# Patient Record
Sex: Male | Born: 1990 | Race: White | Hispanic: No | Marital: Single | Smoking: Current every day smoker
Health system: Southern US, Community
[De-identification: ages and names within clinical notes are randomized; demographics above are authoritative.]

## PROBLEM LIST (undated history)

## (undated) DIAGNOSIS — F1910 Other psychoactive substance abuse, uncomplicated: Secondary | ICD-10-CM

## (undated) DIAGNOSIS — F419 Anxiety disorder, unspecified: Secondary | ICD-10-CM

## (undated) HISTORY — DX: Anxiety disorder, unspecified: F41.9

## (undated) HISTORY — DX: Other psychoactive substance abuse, uncomplicated: F19.10

---

## 2014-08-27 ENCOUNTER — Ambulatory Visit (INDEPENDENT_AMBULATORY_CARE_PROVIDER_SITE_OTHER): Payer: BLUE CROSS/BLUE SHIELD

## 2014-08-27 ENCOUNTER — Ambulatory Visit (INDEPENDENT_AMBULATORY_CARE_PROVIDER_SITE_OTHER): Payer: BLUE CROSS/BLUE SHIELD | Admitting: Family Medicine

## 2014-08-27 VITALS — BP 112/72 | HR 67 | Temp 97.9°F | Resp 16 | Ht 71.0 in | Wt 218.0 lb

## 2014-08-27 DIAGNOSIS — Z418 Encounter for other procedures for purposes other than remedying health state: Secondary | ICD-10-CM

## 2014-08-27 DIAGNOSIS — S93402A Sprain of unspecified ligament of left ankle, initial encounter: Secondary | ICD-10-CM

## 2014-08-27 DIAGNOSIS — Z298 Encounter for other specified prophylactic measures: Secondary | ICD-10-CM

## 2014-08-27 DIAGNOSIS — F1121 Opioid dependence, in remission: Secondary | ICD-10-CM

## 2014-08-27 MED ORDER — GABAPENTIN 300 MG PO CAPS: 300.0000 mg | ORAL_CAPSULE | Freq: Three times a day (TID) | ORAL | Status: DC

## 2014-08-27 NOTE — Patient Instructions (Signed)
Continue your gabapentin - please try to clarify why you are taking  If you do not need it we can continue to taper you off gradually Wear the ankle support as needed and use NSAID mediations (aleve, etc) as needed Let us know if your ankle does not improve over the next few weeks Please work on your balance as we discussed Please come and see us in abut 3 months to discuss your progress.

## 2014-08-27 NOTE — Progress Notes (Signed)
[facility] [address] [place] [address] [phone]  Date:  08/27/2014   Name:  Bob Fowler   DOB:  November 16, 1990   MRN:  [id number]  PCP:  No PCP Per Patient    Chief Complaint: Medication Refill and Ankle Pain   History of Present Illness:  Bob Fowler is a 24 y.o. very pleasant male patient who presents with the following:  He is here today with a left ankle injury.  He is treated with gabapentin for anxiety and "to prevent seizures."  He has never actually had a seizure.     He completed treatment at [facility] 3 months ago.  He has been tapering his gabapentin to 300 mg TID- his current dose,  Not quite sure of what his original dose was when he was discharged.  Needs a RF of this medication  He rolled his left ankle yesterday- about 6 weeks ago he had a similar injury, he is not sure if it healed totally in between.  Notes that his last injury seemed more severe and that could not walk for several days. This time he is able to walk on the ankle, but it is swollen and tender laterally.  He notes a history of frequent left ankle sprains   He is OW generally in good health He is otherwise unhurt from yesterday's accident  He was in [facility] for opiates and benzos.  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   Past Medical History  Diagnosis Date  . Anxiety   . Substance abuse     History reviewed. No pertinent past surgical history.  History  Substance Use Topics  . Smoking status: Current Every Day Smoker  . Smokeless tobacco: Not on file  . Alcohol Use: Not on file    Family History  Problem Relation Age of Onset  . Diabetes Mother   . Mental illness Maternal Grandmother   . Heart disease Maternal Grandfather     No Known Allergies  Medication list has been reviewed and updated.  No current outpatient prescriptions on file prior to  visit.   No current facility-administered medications on file prior to visit.    Review of Systems:  As per HPI- otherwise negative.   Physical Examination: Filed Vitals:   08/27/14 1241  BP: 112/72  Pulse: 67  Temp: 97.9 F (36.6 C)  Resp: 16   Filed Vitals:   08/27/14 1241  Height:  (1.803 m)  Weight: 218 lb (98.884 kg)   Body mass index is 30.42 kg/(m^2). Ideal Body Weight: Weight in (lb) to have BMI = 25: 178.9  GEN: WDWN, NAD, Non-toxic, A & O x 3, mild overweigh, looks well HEENT: Atraumatic, Normocephalic. Neck supple. No masses, No LAD. Ears and Nose: No external deformity. CV: RRR, No M/G/R. No JVD. No thrill. No extra heart sounds. PULM: CTA B, no wheezes, crackles, rhonchi. No retractions. No resp. distress. No accessory muscle use. EXTR: No c/c/e NEURO Normal gait.  PSYCH: Normally interactive. Conversant. Not depressed or anxious appearing.  Calm demeanor.  Left ankle: he has lateral ankle swelling and tenderness, no bruise or redness.  Achilles is intact, no tenderness of the 5th MT, slight tenderness of the anterior ankle as well  [facility] reading (PRIMARY) by  Bob Fowler. Left ankle: possible old talus avulsion fracture    LEFT ANKLE COMPLETE -  3+ VIEW  COMPARISON: None.  FINDINGS: Moderate to marked lateral malleolar soft tissue swelling. Os trigonum, accessory ossicle. Suspect an ankle joint effusion with anterior soft tissue fullness on the lateral view. Base of fifth metatarsal and talar dome intact.  IMPRESSION: Lateral and anterior soft tissue swelling, without acute osseous abnormality.  Assessment and Plan: Left ankle sprain, initial encounter - Plan: DG Ankle Complete Left  History of narcotic addiction  Seizure prophylaxis - Plan: gabapentin (NEURONTIN) 300 MG capsule  Placed in a sweed-o ankle brace for support, discussed gradual return to activities and exercises to improve proprioception Refilled her gabapentin.  He would  like to continue to taper this medication which I think is reasonable ; discussed safe taper regimens, he will see me in about 3 months for a recheck   Signed Bob AmsterdamJessica Tacara Hadlock, MD

## 2014-11-11 ENCOUNTER — Other Ambulatory Visit: Payer: Self-pay | Admitting: Family Medicine

## 2015-04-12 ENCOUNTER — Other Ambulatory Visit: Payer: Self-pay | Admitting: Family Medicine

## 2015-05-04 ENCOUNTER — Ambulatory Visit (INDEPENDENT_AMBULATORY_CARE_PROVIDER_SITE_OTHER): Payer: BLUE CROSS/BLUE SHIELD | Admitting: Physician Assistant

## 2015-05-04 VITALS — BP 118/73 | HR 82 | Temp 98.2°F | Resp 20 | Ht 71.0 in | Wt 181.2 lb

## 2015-05-04 DIAGNOSIS — F419 Anxiety disorder, unspecified: Secondary | ICD-10-CM | POA: Diagnosis not present

## 2015-05-04 DIAGNOSIS — F1121 Opioid dependence, in remission: Secondary | ICD-10-CM

## 2015-05-04 DIAGNOSIS — Z720 Tobacco use: Secondary | ICD-10-CM | POA: Diagnosis not present

## 2015-05-04 DIAGNOSIS — G4700 Insomnia, unspecified: Secondary | ICD-10-CM

## 2015-05-04 MED ORDER — GABAPENTIN 300 MG PO CAPS: ORAL_CAPSULE | ORAL | Status: DC

## 2015-05-04 NOTE — Progress Notes (Signed)
[facility] [address], [place] [id number] [phone]  Date:  05/04/2015   Name:  Bob FesterDylan Fowler   DOB:  12/13/1990   MRN:  [id number]  PCP:  No PCP Per Patient    Chief Complaint: Medication Refill   History of Present Illness:  This is a 25 y.o. male with PMH hx narcotic addiction who is presenting for gabapentin refill. Taking 300 mg gabapentin TID for anxiety. Feels anxious esp in social settings. Feels nervous, palms sweat, wants to hide in a corner. Has had panic attacks before but doesn't get them regularly. Feels the gabapentin helps him. He has never been on an SSRI before. He was started on the gabapentin while being treated for substance abuse of benzos and opiates at [facility] 9 months ago. He has been clean since. Mood is good, generally happy. He just started with a therapist but isn't sure if he likes him. Doesn't sleep well. Wakes every couple hours. Goes to bed around 11 pm. Usually up at 5:30. Not taking anything for sleep. Smokes cigarettes - isn't sure if he wants to quit.   Review of Systems:  Review of Systems See HPI  Patient Active Problem List   Diagnosis Date Noted  . History of narcotic addiction (HCC) 08/27/2014    Prior to Admission medications   Medication Sig Start Date End Date Taking? Authorizing Provider  gabapentin (NEURONTIN) 300 MG capsule TAKE 1 CAPSULE BY MOUTH 3 TIMES DAILY. 11/13/14  Yes Pearline CablesJessica C Copland, MD    No Known Allergies  History reviewed. No pertinent past surgical history.  Social History  Substance Use Topics  . Smoking status: Current Every Day Smoker  . Smokeless tobacco: None  . Alcohol Use: None    Family History  Problem Relation Age of Onset  . Diabetes Mother   . Mental illness Maternal Grandmother   . Heart disease Maternal Grandfather     Medication list has been reviewed and updated.  Physical Examination:  Physical Exam  Constitutional: He is oriented to person,  place, and time. He appears well-developed and well-nourished. No distress.  HENT:  Head: Normocephalic and atraumatic.  Right Ear: Hearing normal.  Left Ear: Hearing normal.  Nose: Nose normal.  Eyes: Conjunctivae and lids are normal. Right eye exhibits no discharge. Left eye exhibits no discharge. No scleral icterus.  Cardiovascular: Normal rate, regular rhythm, normal heart sounds and normal pulses.   No murmur heard. Pulmonary/Chest: Effort normal and breath sounds normal. No respiratory distress. He has no wheezes. He has no rhonchi. He has no rales.  Musculoskeletal: Normal range of motion.  Neurological: He is alert and oriented to person, place, and time.  Skin: Skin is warm, dry and intact. No lesion and no rash noted.  Psychiatric: He has a normal mood and affect. His speech is normal and behavior is normal. Thought content normal.   BP 118/73 mmHg  Pulse 82  Temp(Src) 98.2 F (36.8 C) (Oral)  Resp 20  Ht 5\' 11"  (1.803 m)  Wt 181 lb 3.2 oz (82.192 kg)  BMI 25.28 kg/m2  SpO2 98%  Assessment and Plan:  1. Anxiety Gabapentin working well for him. Can always try an SSRI at a later date if needed. Gave contact info for other therapists in the area. Return in 6 months for follow up. - gabapentin (NEURONTIN) 300 MG capsule; TAKE 1 CAPSULE BY MOUTH 3 TIMES DAILY.  Dispense: 90 capsule; Refill: 5  2. Insomnia Advised  try melatonin up to 10 mg QHS  3. History of narcotic addiction (HCC) In Remission. Doing well.  4. Tobacco abuse Does not desire to quit at this time.   Roswell Miners Dyke Brackett, MHS [facility]  05/04/2015

## 2015-05-04 NOTE — Patient Instructions (Signed)
Continue gabapentin Try melatonin up to 10 mg at night  For therapy -- [facility] ([address], Ernest McCoy, Heather Kitchens, Karla [place]) - [phone] [facility] Raynelle Fanning([place]) - [phone] [facility] - [phone] [facility] - [phone] (do not file insurance) [facility] - accepts most major insurances. [phone] or email [email or web address]

## 2015-06-12 ENCOUNTER — Ambulatory Visit (INDEPENDENT_AMBULATORY_CARE_PROVIDER_SITE_OTHER): Payer: BLUE CROSS/BLUE SHIELD | Admitting: Physician Assistant

## 2015-06-12 ENCOUNTER — Other Ambulatory Visit: Payer: Self-pay | Admitting: Physician Assistant

## 2015-06-12 ENCOUNTER — Ambulatory Visit (HOSPITAL_COMMUNITY)
Admission: RE | Admit: 2015-06-12 | Discharge: 2015-06-12 | Disposition: A | Discharge status: Home / Self Care | Payer: BLUE CROSS/BLUE SHIELD | Source: Ambulatory Visit | Attending: Physician Assistant | Admitting: Physician Assistant

## 2015-06-12 VITALS — BP 124/72 | HR 76 | Temp 97.9°F | Resp 16 | Ht 71.0 in | Wt 190.2 lb

## 2015-06-12 DIAGNOSIS — N5089 Other specified disorders of the male genital organs: Secondary | ICD-10-CM | POA: Diagnosis present

## 2015-06-12 DIAGNOSIS — N451 Epididymitis: Secondary | ICD-10-CM

## 2015-06-12 DIAGNOSIS — K4090 Unilateral inguinal hernia, without obstruction or gangrene, not specified as recurrent: Secondary | ICD-10-CM | POA: Diagnosis not present

## 2015-06-12 LAB — POC MICROSCOPIC URINALYSIS (UMFC): Mucus: ABSENT

## 2015-06-12 LAB — POCT CBC
Granulocyte percent: 66.7 %G (ref 37–80)
HCT, POC: 39.6 % — AB (ref 43.5–53.7)
HEMOGLOBIN: 14 g/dL — AB (ref 14.1–18.1)
LYMPH, POC: 1.8 (ref 0.6–3.4)
MCH: 27.5 pg (ref 27–31.2)
MCHC: 35.5 g/dL — AB (ref 31.8–35.4)
MCV: 77.4 fL — AB (ref 80–97)
MID (cbc): 0.6 (ref 0–0.9)
MPV: 6.3 fL (ref 0–99.8)
PLATELET COUNT, POC: 252 10*3/uL (ref 142–424)
POC Granulocyte: 4.7 (ref 2–6.9)
POC LYMPH PERCENT: 24.8 %L (ref 10–50)
POC MID %: 8.5 %M (ref 0–12)
RBC: 5.11 M/uL (ref 4.69–6.13)
RDW, POC: 14.5 %
WBC: 7.1 10*3/uL (ref 4.6–10.2)

## 2015-06-12 LAB — POCT URINALYSIS DIP (MANUAL ENTRY)
BILIRUBIN UA: NEGATIVE
Bilirubin, UA: NEGATIVE
Blood, UA: NEGATIVE
Glucose, UA: NEGATIVE
Nitrite, UA: NEGATIVE
PH UA: 7
PROTEIN UA: NEGATIVE
Spec Grav, UA: 1.02
Urobilinogen, UA: 0.2

## 2015-06-12 MED ORDER — CIPROFLOXACIN HCL 500 MG PO TABS: 500.0000 mg | ORAL_TABLET | Freq: Two times a day (BID) | ORAL | Status: DC

## 2015-06-12 MED ORDER — IBUPROFEN 800 MG PO TABS: 800.0000 mg | ORAL_TABLET | Freq: Three times a day (TID) | ORAL | Status: AC

## 2015-06-12 NOTE — Addendum Note (Signed)
Addended by: Ofilia NeasLARK, MICHAEL L on: 06/12/2015 07:01 PM   Modules accepted: Orders

## 2015-06-12 NOTE — Progress Notes (Addendum)
06/12/2015 6:16 PM   DOB: January 29, 1991 / MRN: [id number]  SUBJECTIVE:  Bob Fowler is a 25 y.o. male presenting for frank testicular swelling that started three days ago.  Reports that he did some heavy lifting last week but this problem seems to have occurred well after that.  Denies a history of hernia.  Reports the testical is painful.  He felt mildly feverish two nights ago but reports this has resolved.  He has not tried anything for his symptoms.  He denies any new partners and reports he has been with his current girlfriend for years now.  They live in the same household and he reports the relationship is good.   He does practice anal sex with his girlfriend.   He has No Known Allergies.   He  has a past medical history of Anxiety and Substance abuse.    He  reports that he has been smoking.  He does not have any smokeless tobacco history on file. He  has no sexual activity history on file. The patient  has no past surgical history on file.  His family history includes Diabetes in his mother; Heart disease in his maternal grandfather; Mental illness in his maternal grandmother.  Review of Systems  Constitutional: Negative for fever.  Respiratory: Negative for cough.   Gastrointestinal: Negative for nausea, vomiting, abdominal pain, diarrhea and constipation.  Musculoskeletal: Negative for myalgias.  Skin: Negative for rash.  Neurological: Negative for dizziness and headaches.    Problem list and medications reviewed and updated by myself where necessary, and exist elsewhere in the encounter.   OBJECTIVE:  BP 124/72 mmHg  Pulse 76  Temp(Src) 97.9 F (36.6 C) (Oral)  Resp 16  Ht  (1.803 m)  Wt 190 lb 3.2 oz (86.274 kg)  BMI 26.54 kg/m2  SpO2 96%  Physical Exam  Constitutional: He is oriented to person, place, and time. He appears well-developed. He does not appear ill.  Eyes: Conjunctivae and EOM are normal. Pupils are equal, round, and reactive to light.    Cardiovascular: Normal rate and regular rhythm.   Pulmonary/Chest: Effort normal and breath sounds normal.  Abdominal: He exhibits no distension.  Genitourinary:     Musculoskeletal: Normal range of motion.  Neurological: He is alert and oriented to person, place, and time. No cranial nerve deficit. Coordination normal.  Skin: Skin is warm and dry. He is not diaphoretic.  Psychiatric: He has a normal mood and affect.  Nursing note and vitals reviewed.   Results for orders placed or performed in visit on 06/12/15 (from the past 72 hour(s))  POCT urinalysis dipstick     Status: Abnormal   Collection Time: 06/12/15  3:08 PM  Result Value Ref Range   Color, UA yellow yellow   Clarity, UA cloudy (A) clear   Glucose, UA negative negative   Bilirubin, UA negative negative   Ketones, POC UA negative negative   Spec Grav, UA 1.020    Blood, UA negative negative   pH, UA 7.0    Protein Ur, POC negative negative   Urobilinogen, UA 0.2    Nitrite, UA Negative Negative   Leukocytes, UA Trace (A) Negative  POCT Microscopic Urinalysis ([facility])     Status: Abnormal   Collection Time: 06/12/15  3:08 PM  Result Value Ref Range   WBC,UR,HPF,POC Few (A) None WBC/hpf   RBC,UR,HPF,POC None None RBC/hpf   Bacteria Many (A) None, Too numerous to count   Mucus Absent Absent  Epithelial Cells, UR Per Microscopy Few (A) None, Too numerous to count cells/hpf   Amorphous many   POCT CBC     Status: Abnormal   Collection Time: 06/12/15  3:16 PM  Result Value Ref Range   WBC 7.1 4.6 - 10.2 K/uL   Lymph, poc 1.8 0.6 - 3.4   POC LYMPH PERCENT 24.8 10 - 50 %L   MID (cbc) 0.6 0 - 0.9   POC MID % 8.5 0 - 12 %M   POC Granulocyte 4.7 2 - 6.9   Granulocyte percent 66.7 37 - 80 %G   RBC 5.11 4.69 - 6.13 M/uL   Hemoglobin 14.0 (A) 14.1 - 18.1 g/dL   HCT, POC 95.6 (A) 21.3 - 53.7 %   MCV 77.4 (A) 80 - 97 fL   MCH, POC 27.5 27 - 31.2 pg   MCHC 35.5 (A) 31.8 - 35.4 g/dL   RDW, POC 08.6 %   Platelet  Count, POC 252 142 - 424 K/uL   MPV 6.3 0 - 99.8 fL    US Scrotum  06/12/2015  CLINICAL DATA:  25 year old male with right-sided testicular pain and swelling for the past 2 days, progressively worsening. EXAM: SCROTAL ULTRASOUND DOPPLER ULTRASOUND OF THE TESTICLES TECHNIQUE: Complete ultrasound examination of the testicles, epididymis, and other scrotal structures was performed. Color and spectral Doppler ultrasound were also utilized to evaluate blood flow to the testicles. COMPARISON:  No priors. FINDINGS: Right testicle Measurements: 3.3 x 2.4 x 3.4 cm. No mass or microlithiasis visualized. Left testicle Measurements: 4.1 x 2.0 x 3.0 cm. No mass or microlithiasis visualized. Right epididymis:  Normal in size and appearance. Left epididymis:  Normal in size and appearance. Hydrocele:  None visualized. Varicocele:  None visualized. Pulsed Doppler interrogation of both testes demonstrates normal low resistance arterial and venous waveforms bilaterally, however, there is increased flow in the right epididymis when compared to the left. Other:  Fat and vessels noted in the right inguinal canal. IMPRESSION: 1. Findings suggestive of right-sided epididymitis. 2. Small right inguinal hernia containing fat and vessels. Electronically Signed   By: Trudie Reed M.D.   On: 06/12/2015 16:17   [place] Art/ven Flow Abd Pelv Doppler  06/12/2015  CLINICAL DATA:  25 year old male with right-sided testicular pain and swelling for the past 2 days, progressively worsening. EXAM: SCROTAL ULTRASOUND DOPPLER ULTRASOUND OF THE TESTICLES TECHNIQUE: Complete ultrasound examination of the testicles, epididymis, and other scrotal structures was performed. Color and spectral Doppler ultrasound were also utilized to evaluate blood flow to the testicles. COMPARISON:  No priors. FINDINGS: Right testicle Measurements: 3.3 x 2.4 x 3.4 cm. No mass or microlithiasis visualized. Left testicle Measurements: 4.1 x 2.0 x 3.0 cm. No mass or  microlithiasis visualized. Right epididymis:  Normal in size and appearance. Left epididymis:  Normal in size and appearance. Hydrocele:  None visualized. Varicocele:  None visualized. Pulsed Doppler interrogation of both testes demonstrates normal low resistance arterial and venous waveforms bilaterally, however, there is increased flow in the right epididymis when compared to the left. Other:  Fat and vessels noted in the right inguinal canal. IMPRESSION: 1. Findings suggestive of right-sided epididymitis. 2. Small right inguinal hernia containing fat and vessels. Electronically Signed   By: Trudie Reed M.D.   On: 06/12/2015 16:17    ASSESSMENT AND PLAN  Wanya was seen today for testicle pain.  Diagnoses and all orders for this visit:  Testicular swelling, right: Urine leukocytes noted on UA.  Will culture.  His  right testicle is roughly 2 times larger than the left.  Obvious concerns include testicular torsion, however this symptoms has been present for two days, there is no transverse lie, and his testicle is not exquisitely tender, however he may be headed in that direction.  Doubt hernia however it remains possible.  He denies new sexual partners and reports his girlfriend and he are monogamous.   No transillumination of the right testicle and no varicocele appreciated.  [facility] so he will have access to a hospital if that is needed.  If normal will treat him for epididymitis and will consider cipro given urine leukocytes.   -     US Scrotum; Future -     POCT urinalysis dipstick -     POCT Microscopic Urinalysis ([facility]) -     POCT CBC    The patient was advised to call or return to clinic if he does not see an improvement in symptoms or to seek the care of the closest emergency department if he worsens with the above plan.   Deliah BostonMichael Mozetta Murfin, MHS, PA-C [facility] 06/12/2015 6:16 PM   Addendum: US  showing right sided epididymitis and a small hernia. He does report practicing anal sex with his girlfriend.  This fits with non sti epididymitis.  I could not palpate a hernia.  I spoke with Dr. Llana AlimentEntrikin via phone and he felt that the inguinal hernia was most likely non contributory, and also stated it was very common.  He did not feel that anything was urgent regarding this finding.  Deliah BostonMichael Madelline Eshbach, MS, PA-C 7:01 PM, 06/12/2015

## 2015-06-12 NOTE — Patient Instructions (Signed)
     IF you received an x-ray today, you will receive an invoice from [facility]. Please contact [facility] at [phone] with questions or concerns regarding your invoice.   IF you received labwork today, you will receive an invoice from [facility]. Please contact [facility] at [phone] with questions or concerns regarding your invoice.   Our billing staff will not be able to assist you with questions regarding bills from these companies.  You will be contacted with the lab results as soon as they are available. The fastest way to get your results is to activate your My Chart account. Instructions are located on the last page of this paperwork. If you have not heard from us regarding the results in 2 weeks, please contact this office.      

## 2015-06-13 LAB — URINE CULTURE
Colony Count: NO GROWTH
Organism ID, Bacteria: NO GROWTH

## 2015-06-13 LAB — PSA: PSA: 0.23 ng/mL (ref <=4.00)

## 2015-06-25 ENCOUNTER — Other Ambulatory Visit: Payer: Self-pay | Admitting: Physician Assistant

## 2015-06-26 ENCOUNTER — Other Ambulatory Visit: Payer: Self-pay

## 2015-06-26 DIAGNOSIS — F419 Anxiety disorder, unspecified: Secondary | ICD-10-CM

## 2015-06-26 MED ORDER — GABAPENTIN 300 MG PO CAPS: ORAL_CAPSULE | ORAL | Status: DC

## 2016-09-09 ENCOUNTER — Encounter (HOSPITAL_COMMUNITY): Payer: Self-pay | Admitting: Emergency Medicine

## 2016-09-09 ENCOUNTER — Ambulatory Visit (HOSPITAL_COMMUNITY)
Admission: EM | Admit: 2016-09-09 | Discharge: 2016-09-09 | Disposition: A | Discharge status: Home / Self Care | Payer: BLUE CROSS/BLUE SHIELD | Attending: Internal Medicine | Admitting: Internal Medicine

## 2016-09-09 DIAGNOSIS — B349 Viral infection, unspecified: Secondary | ICD-10-CM

## 2016-09-09 MED ORDER — ONDANSETRON 4 MG PO TBDP: 4.0000 mg | ORAL_TABLET | Freq: Three times a day (TID) | ORAL | 0 refills | Status: DC | PRN

## 2016-09-09 NOTE — ED Provider Notes (Signed)
CSN: [id number]     Arrival date & time 09/09/16  1416 History   First MD Initiated Contact with Patient 09/09/16 1512     Chief Complaint  Patient presents with  . Fever   (Consider location/radiation/quality/duration/timing/severity/associated sxs/prior Treatment) The history is provided by the patient.  URI  Presenting symptoms: congestion, fatigue and fever   Presenting symptoms: no cough, no ear pain, no facial pain, no rhinorrhea and no sore throat   Severity:  Moderate Onset quality:  Gradual Duration:  1 day Timing:  Constant Progression:  Unchanged Chronicity:  New Relieved by:  None tried Worsened by:  Nothing Ineffective treatments:  None tried Associated symptoms: myalgias   Associated symptoms: no arthralgias, no headaches, no neck pain, no sinus pain, no sneezing, no swollen glands and no wheezing   Risk factors: no recent illness and no recent travel     Past Medical History:  Diagnosis Date  . Anxiety   . Substance abuse    History reviewed. No pertinent surgical history. Family History  Problem Relation Age of Onset  . Diabetes Mother   . Mental illness Maternal Grandmother   . Heart disease Maternal Grandfather    Social History  Substance Use Topics  . Smoking status: Current Every Day Smoker    Packs/day: 1.00    Types: Cigarettes  . Smokeless tobacco: Never Used  . Alcohol use 0.0 oz/week    Review of Systems  Constitutional: Positive for fatigue and fever.  HENT: Positive for congestion. Negative for ear pain, rhinorrhea, sinus pain, sneezing and sore throat.   Respiratory: Negative for cough and wheezing.   Cardiovascular: Negative.   Gastrointestinal: Positive for nausea and vomiting. Negative for abdominal pain and constipation.  Musculoskeletal: Positive for myalgias. Negative for arthralgias and neck pain.  Neurological: Negative for light-headedness and headaches.    Allergies  Patient has no known allergies.  Home Medications    Prior to Admission medications   Medication Sig Start Date End Date Taking? Authorizing Provider  ondansetron (ZOFRAN ODT) 4 MG disintegrating tablet Take 1 tablet (4 mg total) by mouth every 8 (eight) hours as needed for nausea or vomiting. 09/09/16   Dorena BodoKennard, Naela Nodal, NP   Meds Ordered and Administered this Visit  Medications - No data to display  BP (!) 118/51 (BP Location: Right Arm)   Pulse 61   Temp 98.1 F (36.7 C) (Oral)   Resp 20   SpO2 100%  No data found.   Physical Exam  Constitutional: He is oriented to person, place, and time. He appears well-developed and well-nourished. No distress.  HENT:  Head: Normocephalic.  Right Ear: Tympanic membrane and external ear normal.  Left Ear: Tympanic membrane and external ear normal.  Nose: Nose normal.  Mouth/Throat: Uvula is midline, oropharynx is clear and moist and mucous membranes are normal. Tonsils are 2+ on the right. Tonsils are 2+ on the left. No tonsillar exudate.  Eyes: Conjunctivae are normal.  Neck: Normal range of motion.  Cardiovascular: Normal rate and regular rhythm.   Pulmonary/Chest: Effort normal and breath sounds normal.  Abdominal: Soft. Bowel sounds are normal. There is no tenderness.  Lymphadenopathy:    He has no cervical adenopathy.  Neurological: He is alert and oriented to person, place, and time.  Skin: Skin is warm and dry. Capillary refill takes less than 2 seconds. He is not diaphoretic.  Nursing note and vitals reviewed.   Urgent Care Course     Procedures (including critical care time)  Labs Review Labs Reviewed - No data to display  Imaging Review No results found.     MDM   1. Viral illness    Most likely viral illness, recommend rest, fluids, over-the-counter therapies for symptom management. Given Zofran for nausea vomiting, follow-up with primary care return to clinic as needed.    Dorena Bodo, NP 09/09/16 1529

## 2016-09-09 NOTE — ED Triage Notes (Signed)
Pt c/o fever onset 5 days associated w/fatigue, nausea, vomiting, loss of appetite  Last had ibuprofen this am w/some relief  A&O x4... NAD... Ambulatory

## 2016-09-09 NOTE — Discharge Instructions (Signed)
You most likely have a viral illness, this type of infection will not be helped by antibiotics.  For your symptoms I have prescribed Zofran, take 1 tablet under the tongue every 8 hours as needed for nausea.  In addition to these therapies, I advise rest, plenty of fluids and management of symptoms with over the counter medicines. Over the counter therapies for your symptoms include:Tylenol as needed every 4-6 hours for body aches or fever, not to exceed 4,000 mg a day, Take mucinex or mucinex DM ever 12 hours with a full glass of water, you may use an inhaled steroid such as Flonase, 2 sprays each nostril once a day for congestion, or an antihistamine such as Claritin or Zyrtec once a day. Another alternative for congestion, is a pseudoephedrine containing product available from the pharmacist. Should your symptoms worsen or fail to resolve, follow up with your primary care provider or return to clinic.

## 2016-10-01 ENCOUNTER — Encounter (HOSPITAL_COMMUNITY): Payer: Self-pay

## 2016-10-01 ENCOUNTER — Emergency Department (HOSPITAL_COMMUNITY): Payer: BLUE CROSS/BLUE SHIELD

## 2016-10-01 ENCOUNTER — Emergency Department (HOSPITAL_COMMUNITY)
Admission: EM | Admit: 2016-10-01 | Discharge: 2016-10-01 | Disposition: A | Discharge status: Home / Self Care | Payer: BLUE CROSS/BLUE SHIELD | Attending: Emergency Medicine | Admitting: Emergency Medicine

## 2016-10-01 DIAGNOSIS — S82892A Other fracture of left lower leg, initial encounter for closed fracture: Secondary | ICD-10-CM

## 2016-10-01 DIAGNOSIS — S99912A Unspecified injury of left ankle, initial encounter: Secondary | ICD-10-CM | POA: Diagnosis present

## 2016-10-01 DIAGNOSIS — Y929 Unspecified place or not applicable: Secondary | ICD-10-CM | POA: Diagnosis not present

## 2016-10-01 DIAGNOSIS — Y9351 Activity, roller skating (inline) and skateboarding: Secondary | ICD-10-CM | POA: Diagnosis not present

## 2016-10-01 DIAGNOSIS — F17210 Nicotine dependence, cigarettes, uncomplicated: Secondary | ICD-10-CM | POA: Diagnosis not present

## 2016-10-01 DIAGNOSIS — Y999 Unspecified external cause status: Secondary | ICD-10-CM | POA: Insufficient documentation

## 2016-10-01 DIAGNOSIS — S92155A Nondisplaced avulsion fracture (chip fracture) of left talus, initial encounter for closed fracture: Secondary | ICD-10-CM | POA: Diagnosis not present

## 2016-10-01 DIAGNOSIS — S93402A Sprain of unspecified ligament of left ankle, initial encounter: Secondary | ICD-10-CM

## 2016-10-01 NOTE — ED Provider Notes (Signed)
[facility]-EMERGENCY DEPT Provider Note   CSN: [id number] Arrival date & time: 10/01/16  1306     History   Chief Complaint Chief Complaint  Patient presents with  . Ankle Pain    HPI Bob Fowler is a 26 y.o. male with a PMHx of anxiety and prior substance abuse/narcotic addiction, who presents to the ED with complaints of left ankle injury sustained yesterday after he rolled it while on his skateboard. He states that he has injured this ankle many times before, but does not have an orthopedist here as [place]. He describes his pain is 8/10 constant throbbing and aching nonradiating left ankle pain that worsens with movement and weightbearing and has been unrelieved with Advil but had mild relief from RICE. He reports associated bruising and swelling. He denies head injury or LOC, numbness, tingling, focal weakness, abrasions, or any other injuries sustained or other complaints at this time.   The history is provided by the patient and medical records. No language interpreter was used.  Ankle Pain   The incident occurred yesterday. The incident occurred in the [place]. The injury mechanism was a fall. The pain is present in the left ankle. The quality of the pain is described as aching and throbbing. The pain is at a severity of 8/10. The pain is moderate. The pain has been constant since onset. Associated symptoms include inability to bear weight. Pertinent negatives include no numbness, no muscle weakness, no loss of sensation and no tingling. He reports no foreign bodies present. The symptoms are aggravated by bearing weight and activity. He has tried ice, elevation, rest and NSAIDs for the symptoms. The treatment provided mild relief.    Past Medical History:  Diagnosis Date  . Anxiety   . Substance abuse     Patient Active Problem List   Diagnosis Date Noted  . Tobacco abuse 05/04/2015  . Anxiety 05/04/2015  . History of narcotic addiction (HCC) 08/27/2014    History  reviewed. No pertinent surgical history.     Home Medications    Prior to Admission medications   Medication Sig Start Date End Date Taking? Authorizing Provider  ondansetron (ZOFRAN ODT) 4 MG disintegrating tablet Take 1 tablet (4 mg total) by mouth every 8 (eight) hours as needed for nausea or vomiting. 09/09/16   Dorena BodoKennard, Lawrence, NP    Family History Family History  Problem Relation Age of Onset  . Diabetes Mother   . Mental illness Maternal Grandmother   . Heart disease Maternal Grandfather     Social History Social History  Substance Use Topics  . Smoking status: Current Every Day Smoker    Packs/day: 1.00    Types: Cigarettes  . Smokeless tobacco: Never Used  . Alcohol use 0.0 oz/week     Allergies   Patient has no known allergies.   Review of Systems Review of Systems  HENT: Negative for facial swelling (no head inj).   Musculoskeletal: Positive for arthralgias and joint swelling.  Skin: Positive for color change. Negative for wound.  Allergic/Immunologic: Negative for immunocompromised state.  Neurological: Negative for tingling, syncope, weakness and numbness.  Psychiatric/Behavioral: Negative for confusion.     Physical Exam Updated Vital Signs BP 133/76 (BP Location: Left Arm)   Pulse 77   Temp 98.2 F (36.8 C) (Oral)   Resp 16   SpO2 98%   Physical Exam  Constitutional: He is oriented to person, place, and time. Vital signs are normal. He appears well-developed and well-nourished.  Non-toxic appearance. No distress.  Afebrile, nontoxic, NAD  HENT:  Head: Normocephalic and atraumatic.  Mouth/Throat: Mucous membranes are normal.  Eyes: Conjunctivae and EOM are normal. Right eye exhibits no discharge. Left eye exhibits no discharge.  Neck: Normal range of motion. Neck supple.  Cardiovascular: Normal rate and intact distal pulses.   Pulmonary/Chest: Effort normal. No respiratory distress.  Abdominal: Normal appearance. He exhibits no  distension.  Musculoskeletal:       Left ankle: He exhibits decreased range of motion (due to pain), swelling and ecchymosis. He exhibits no deformity, no laceration and normal pulse. Tenderness. Lateral malleolus and medial malleolus tenderness found. Achilles tendon normal.       Feet:  L ankle with mildly limited ROM due to pain, +swelling, no crepitus or deformity, with mild TTP of the lateral and medial malleoli and slightly tender over dorsum of ankle but more tender over malloli, and no TTP or swelling of fore foot or calf. No break in skin. +bruising, no erythema. No warmth. Achilles intact. Good pedal pulse and cap refill of all toes. Wiggling toes without difficulty. Sensation grossly intact. Soft compartments   Neurological: He is alert and oriented to person, place, and time. He has normal strength. No sensory deficit.  Skin: Skin is warm, dry and intact. No rash noted.  Psychiatric: He has a normal mood and affect.  Nursing note and vitals reviewed.    ED Treatments / Results  Labs (all labs ordered are listed, but only abnormal results are displayed) Labs Reviewed - No data to display  EKG  EKG Interpretation None       Radiology Dg Ankle Complete Left  Result Date: 10/01/2016 CLINICAL DATA:  Rolled ankle yesterday while skateboarding. Lateral ankle pain and swelling. Initial encounter. EXAM: LEFT ANKLE COMPLETE - 3+ VIEW COMPARISON:  08/27/2014 FINDINGS: A tiny ossific density seen along the dorsal aspect of the talar neck which was not seen on previous study. This is suspicious for a small avulsion fracture fragment, and is of indeterminate age. No other fractures or bone lesions identified. IMPRESSION: Tiny avulsion fracture fragment along the dorsal aspect of the talar neck, which is of indeterminate age radiographically. Recommend clinical correlation for point tenderness at this site. Electronically Signed   By: Myles RosenthalJohn  Stahl M.D.   On: 10/01/2016 13:44     Procedures Procedures (including critical care time)  Medications Ordered in ED Medications - No data to display   Initial Impression / Assessment and Plan / ED Course  I have reviewed the triage vital signs and the nursing notes.  Pertinent labs & imaging results that were available during my care of the patient were reviewed by me and considered in my medical decision making (see chart for details).     26 y.o. male here with L ankle injury sustained yesterday when he rolled his ankle; hx of injury to same ankle. On exam, mild swelling and TTP around bilateral malleoli and somewhat to dorsum of ankle, mild bruising, NVI with soft compartments. Xray showing tiny avulsion fx on dorsal talar neck which is age indeterminate. He's somewhat point tender in this area, although more tender over the malleolar areas, so hard to say if this is acute or not. Will treat conservatively as fx; CAM walker applied, pt has crutches so he declined; advised RICE, tylenol/motrin for pain, and f/up with podiatry in 5-7 days for ongoing management and recheck of symptoms. I explained the diagnosis and have given explicit precautions to return to  the ER including for any other new or worsening symptoms. The patient understands and accepts the medical plan as it's been dictated and I have answered their questions. Discharge instructions concerning home care and prescriptions have been given. The patient is STABLE and is discharged to home in good condition.    Final Clinical Impressions(s) / ED Diagnoses   Final diagnoses:  Closed avulsion fracture of left ankle, initial encounter  Sprain of left ankle, unspecified ligament, initial encounter    New Prescriptions New Prescriptions   No medications on [address] [place], [place] 10/01/16 1411    Derwood Kaplan, MD 10/02/16 [phone]

## 2016-10-01 NOTE — Discharge Instructions (Signed)
Wear CAM walker and Use crutches for all weight bearing activities at all times until you see the podiatrist. Ice and elevate ankle throughout the day, using ice pack for no more than 20 minutes every hour.  Alternate between tylenol and motrin as needed for pain relief. Follow up with the podiatrist in 1 week for recheck of symptoms and ongoing management of your injury. Return to the ER for changes or worsening symptoms.

## 2016-10-01 NOTE — ED Triage Notes (Signed)
Pt rolled left ankle yesterday on skateboard.  Painful to walk

## 2016-11-03 ENCOUNTER — Encounter: Payer: Self-pay | Admitting: Physician Assistant

## 2016-11-03 ENCOUNTER — Ambulatory Visit (INDEPENDENT_AMBULATORY_CARE_PROVIDER_SITE_OTHER): Payer: BLUE CROSS/BLUE SHIELD | Admitting: Physician Assistant

## 2016-11-03 VITALS — BP 116/77 | HR 77 | Temp 97.9°F | Resp 16 | Ht 71.0 in | Wt 215.8 lb

## 2016-11-03 DIAGNOSIS — K529 Noninfective gastroenteritis and colitis, unspecified: Secondary | ICD-10-CM | POA: Diagnosis not present

## 2016-11-03 NOTE — Progress Notes (Deleted)
    11/03/2016 2:56 PM   DOB: 05/16/1990 / MRN: [id number]  SUBJECTIVE:  Bob Fowler is a 26 y.o. male presenting for   He has No Known Allergies.   He  has a past medical history of Anxiety and Substance abuse.    He  reports that he has been smoking Cigarettes.  He has been smoking about 1.00 pack per day. He has never used smokeless tobacco. He reports that he drinks alcohol. He reports that he uses drugs, including Marijuana. He  has no sexual activity history on file. The patient  has no past surgical history on file.  His family history includes Diabetes in his mother; Heart disease in his maternal grandfather; Mental illness in his maternal grandmother.  ROS  The problem list and medications were reviewed and updated by myself where necessary and exist elsewhere in the encounter.   OBJECTIVE:  BP 116/77 (BP Location: Right Arm, Patient Position: Sitting, Cuff Size: Normal)   Pulse 77   Temp 97.9 F (36.6 C) (Oral)   Resp 16   Ht 5\' 11"  (1.803 m)   Wt 215 lb 12.8 oz (97.9 kg)   SpO2 99%   BMI 30.10 kg/m   Physical Exam  No results found for this or any previous visit (from the past 72 hour(s)).  No results found.  ASSESSMENT AND PLAN:  There are no diagnoses linked to this encounter.  The patient is advised to call or return to clinic if he does not see an improvement in symptoms, or to seek the care of the closest emergency department if he worsens with the above plan.   Deliah BostonMichael Clark, MHS, PA-C Primary Care at [facility] 11/03/2016 2:56 PM

## 2016-11-03 NOTE — Patient Instructions (Addendum)
Come back if you have diarrhea that persist for greater than ten days, or if you become dehydrated.     IF you received an x-ray today, you will receive an invoice from [facility]. Please contact [facility] at [phone] with questions or concerns regarding your invoice.   IF you received labwork today, you will receive an invoice from [facility]. Please contact [facility] at [phone] with questions or concerns regarding your invoice.   Our billing staff will not be able to assist you with questions regarding bills from these companies.  You will be contacted with the lab results as soon as they are available. The fastest way to get your results is to activate your My Chart account. Instructions are located on the last page of this paperwork. If you have not heard from us regarding the results in 2 weeks, please contact this office.

## 2016-11-03 NOTE — Progress Notes (Signed)
    11/03/2016 3:12 PM   DOB: 11/15/1990 / MRN: [id number]  SUBJECTIVE:  Bob Fowler is a 26 y.o. male presenting for a work note.  Started having nausea and 3 episodes of non bloody emesis after eating some [employer] food at [place] [employer] and started to feel sick almost immediately after. 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  He went home and slept and today feels much better. His manager wants a work note.   He has No Known Allergies.   He  has a past medical history of Anxiety and Substance abuse.    He  reports that he has been smoking Cigarettes.  He has been smoking about 1.00 pack per day. He has never used smokeless tobacco. He reports that he drinks alcohol. He reports that he uses drugs, including Marijuana. He  has no sexual activity history on file. The patient  has no past surgical history on file.  His family history includes Diabetes in his mother; Heart disease in his maternal grandfather; Mental illness in his maternal grandmother.  Review of Systems  Constitutional: Negative for chills, diaphoresis and fever.  Respiratory: Negative for shortness of breath.   Cardiovascular: Negative for chest pain, orthopnea and leg swelling.  Gastrointestinal: Positive for nausea and vomiting (resolved). Negative for abdominal pain, blood in stool, constipation, diarrhea, heartburn and melena.  Genitourinary: Negative for flank pain.  Skin: Negative for rash.  Neurological: Negative for dizziness.    The problem list and medications were reviewed and updated by myself where necessary and exist elsewhere in the encounter.   OBJECTIVE:  BP 116/77 (BP Location: Right Arm, Patient Position: Sitting, Cuff Size: Normal)   Pulse 77   Temp 97.9 F (36.6 C) (Oral)   Resp 16   Ht 5\' 11"  (1.803 m)   Wt 215 lb 12.8 oz (97.9 kg)   SpO2 99%   BMI 30.10 kg/m   Physical Exam  Constitutional: He appears well-developed. He is active and cooperative.  Non-toxic appearance.    Cardiovascular: Normal rate.   Pulmonary/Chest: Effort normal. No tachypnea.  Abdominal: Soft. Normal appearance and bowel sounds are normal. He exhibits no distension and no mass. There is no tenderness. There is no rigidity, no rebound, no guarding and no CVA tenderness. No hernia.  Neurological: He is alert.  Skin: Skin is warm and dry. He is not diaphoretic. No pallor.  Vitals reviewed.   No results found for this or any previous visit (from the past 72 hour(s)).  No results found.  ASSESSMENT AND PLAN:  Bob Fowler was seen today for fever and emesis.  Diagnoses and all orders for this visit:  Noninfectious gastroenteritis, unspecified type: He does not want meds.  Work note composed for RTW tomorrow.     The patient is advised to call or return to clinic if he does not see an improvement in symptoms, or to seek the care of the closest emergency department if he worsens with the above plan.   Bob Fowler, MHS, PA-C Primary Care at [facility] 11/03/2016 3:12 PM

## 2016-11-21 ENCOUNTER — Ambulatory Visit: Payer: BLUE CROSS/BLUE SHIELD | Admitting: Family Medicine

## 2016-11-22 ENCOUNTER — Ambulatory Visit: Payer: BLUE CROSS/BLUE SHIELD | Admitting: Family Medicine

## 2016-12-07 ENCOUNTER — Encounter: Payer: Self-pay | Admitting: Emergency Medicine

## 2016-12-07 ENCOUNTER — Ambulatory Visit (INDEPENDENT_AMBULATORY_CARE_PROVIDER_SITE_OTHER): Payer: BLUE CROSS/BLUE SHIELD | Admitting: Emergency Medicine

## 2016-12-07 VITALS — BP 100/64 | HR 74 | Temp 98.2°F | Resp 16 | Ht 70.5 in | Wt 207.4 lb

## 2016-12-07 DIAGNOSIS — B349 Viral infection, unspecified: Secondary | ICD-10-CM | POA: Diagnosis not present

## 2016-12-07 DIAGNOSIS — R509 Fever, unspecified: Secondary | ICD-10-CM | POA: Diagnosis not present

## 2016-12-07 DIAGNOSIS — R61 Generalized hyperhidrosis: Secondary | ICD-10-CM | POA: Diagnosis not present

## 2016-12-07 NOTE — Patient Instructions (Addendum)
   IF you received an x-ray today, you will receive an invoice from [facility]. Please contact [facility] at [phone] with questions or concerns regarding your invoice.   IF you received labwork today, you will receive an invoice from [facility]. Please contact [facility] at [phone] with questions or concerns regarding your invoice.   Our billing staff will not be able to assist you with questions regarding bills from these companies.  You will be contacted with the lab results as soon as they are available. The fastest way to get your results is to activate your My Chart account. Instructions are located on the last page of this paperwork. If you have not heard from us regarding the results in 2 weeks, please contact this office.     Viral Illness, Adult Viruses are tiny germs that can get into a person's body and cause illness. There are many different types of viruses, and they cause many types of illness. Viral illnesses can range from mild to severe. They can affect various parts of the body. Common illnesses that are caused by a virus include colds and the flu. Viral illnesses also include serious conditions such as HIV/AIDS (human immunodeficiency virus/acquired immunodeficiency syndrome). A few viruses have been linked to certain cancers. What are the causes? Many types of viruses can cause illness. Viruses invade cells in your body, multiply, and cause the infected cells to malfunction or die. When the cell dies, it releases more of the virus. When this happens, you develop symptoms of the illness, and the virus continues to spread to other cells. If the virus takes over the function of the cell, it can cause the cell to divide and grow out of control, as is the case when a virus causes cancer. Different viruses get into the body in different ways. You can get a virus by:  Swallowing food or water that is contaminated with the virus.  Breathing in droplets  that have been coughed or sneezed into the air by an infected person.  Touching a surface that has been contaminated with the virus and then touching your eyes, nose, or mouth.  Being bitten by an insect or animal that carries the virus.  Having sexual contact with a person who is infected with the virus.  Being exposed to blood or fluids that contain the virus, either through an open cut or during a transfusion.  If a virus enters your body, your body's defense system (immune system) will try to fight the virus. You may be at higher risk for a viral illness if your immune system is weak. What are the signs or symptoms? Symptoms vary depending on the type of virus and the location of the cells that it invades. Common symptoms of the main types of viral illnesses include: Cold and flu viruses  Fever.  Headache.  Sore throat.  Muscle aches.  Nasal congestion.  Cough. Digestive system (gastrointestinal) viruses  Fever.  Abdominal pain.  Nausea.  Diarrhea. Liver viruses (hepatitis)  Loss of appetite.  Tiredness.  Yellowing of the skin (jaundice). Brain and spinal cord viruses  Fever.  Headache.  Stiff neck.  Nausea and vomiting.  Confusion or sleepiness. Skin viruses  Warts.  Itching.  Rash. Sexually transmitted viruses  Discharge.  Swelling.  Redness.  Rash. How is this treated? Viruses can be difficult to treat because they live within cells. Antibiotic medicines do not treat viruses because these drugs do not get inside cells. Treatment for a viral illness   may include:  Resting and drinking plenty of fluids.  Medicines to relieve symptoms. These can include over-the-counter medicine for pain and fever, medicines for cough or congestion, and medicines to relieve diarrhea.  Antiviral medicines. These drugs are available only for certain types of viruses. They may help reduce flu symptoms if taken early. There are also many antiviral medicines  for hepatitis and HIV/AIDS.  Some viral illnesses can be prevented with vaccinations. A common example is the flu shot. Follow these instructions at home: Medicines   Take over-the-counter and prescription medicines only as told by your health care provider.  If you were prescribed an antiviral medicine, take it as told by your health care provider. Do not stop taking the medicine even if you start to feel better.  Be aware of when antibiotics are needed and when they are not needed. Antibiotics do not treat viruses. If your health care provider thinks that you may have a bacterial infection as well as a viral infection, you may get an antibiotic. ? Do not ask for an antibiotic prescription if you have been diagnosed with a viral illness. That will not make your illness go away faster. ? Frequently taking antibiotics when they are not needed can lead to antibiotic resistance. When this develops, the medicine no longer works against the bacteria that it normally fights. General instructions  Drink enough fluids to keep your urine clear or pale yellow.  Rest as much as possible.  Return to your normal activities as told by your health care provider. Ask your health care provider what activities are safe for you.  Keep all follow-up visits as told by your health care provider. This is important. How is this prevented? Take these actions to reduce your risk of viral infection:  Eat a healthy diet and get enough rest.  Wash your hands often with soap and water. This is especially important when you are in public places. If soap and water are not available, use hand sanitizer.  Avoid close contact with friends and family who have a viral illness.  If you travel to areas where viral gastrointestinal infection is common, avoid drinking water or eating raw food.  Keep your immunizations up to date. Get a flu shot every year as told by your health care provider.  Do not share toothbrushes,  nail clippers, razors, or needles with other people.  Always practice safe sex.  Contact a health care provider if:  You have symptoms of a viral illness that do not go away.  Your symptoms come back after going away.  Your symptoms get worse. Get help right away if:  You have trouble breathing.  You have a severe headache or a stiff neck.  You have severe vomiting or abdominal pain. This information is not intended to replace advice given to you by your health care provider. Make sure you discuss any questions you have with your health care provider. Document Released: 06/26/2015 Document Revised: 07/29/2015 Document Reviewed: 06/26/2015 Elsevier Interactive Patient Education  2018 [employer]  

## 2016-12-07 NOTE — Progress Notes (Signed)
Bob Fowler 25 y.o.   Chief Complaint  Patient presents with  . Headache    with weakness 2-3 days on and off    HISTORY OF PRESENT ILLNESS: This is a 26 y.o. male has been having weakness and headaches with episodes of feeling sweaty, having fever and chills, spontaneously resolving for the past 3-4 months; no other significant symptoms.  HPI   Prior to Admission medications   Medication Sig Start Date End Date Taking? Authorizing Provider  lamoTRIgine (LAMICTAL) 25 MG tablet Take 25 mg by mouth daily.   Yes [provider]    No Known Allergies  Patient Active Problem List   Diagnosis Date Noted  . Tobacco abuse 05/04/2015  . Anxiety 05/04/2015  . History of narcotic addiction (HCC) 08/27/2014    Past Medical History:  Diagnosis Date  . Anxiety   . Substance abuse (HCC)     No past surgical history on file.  Social History   Social History  . Marital status: Single    Spouse name: N/A  . Number of children: N/A  . Years of education: N/A   Occupational History  . Not on file.   Social History Main Topics  . Smoking status: Current Every Day Smoker    Packs/day: 1.00    Types: Cigarettes  . Smokeless tobacco: Never Used  . Alcohol use 0.0 oz/week  . Drug use: Yes    Types: Marijuana  . Sexual activity: Not on file   Other Topics Concern  . Not on file   Social History Narrative  . No narrative on file    Family History  Problem Relation Age of Onset  . Diabetes Mother   . Mental illness Maternal Grandmother   . Heart disease Maternal Grandfather      Review of Systems  Constitutional: Positive for chills, diaphoresis and fever.       Intermittent symptoms  HENT: Negative.  Negative for congestion, nosebleeds, sinus pain and sore throat.   Eyes: Negative.  Negative for blurred vision, double vision, discharge and redness.  Respiratory: Negative.  Negative for cough and shortness of breath.   Cardiovascular: Negative.  Negative  for chest pain and palpitations.  Gastrointestinal: Negative.  Negative for abdominal pain, blood in stool, constipation, diarrhea, nausea and vomiting.  Genitourinary: Negative.  Negative for dysuria and hematuria.  Musculoskeletal: Negative.  Negative for back pain, joint pain, myalgias and neck pain.  Skin: Negative.  Negative for rash.  Neurological: Negative.  Negative for dizziness and headaches.  Endo/Heme/Allergies: Negative.   All other systems reviewed and are negative.   Vitals:   12/07/16 1205  BP: 100/64  Pulse: 74  Resp: 16  Temp: 98.2 F (36.8 C)  SpO2: 99%    Physical Exam  Constitutional: He is oriented to person, place, and time. He appears well-developed and well-nourished.  HENT:  Head: Normocephalic.  Right Ear: External ear normal.  Left Ear: External ear normal.  Nose: Nose normal.  Mouth/Throat: Oropharynx is clear and moist.  Eyes: Pupils are equal, round, and reactive to light. Conjunctivae and EOM are normal.  Neck: Normal range of motion. Neck supple. No JVD present. No thyromegaly present.  Cardiovascular: Normal rate, regular rhythm, normal heart sounds and intact distal pulses.   Pulmonary/Chest: Effort normal and breath sounds normal.  Abdominal: Soft. Bowel sounds are normal. He exhibits no distension. There is no tenderness.  Musculoskeletal: Normal range of motion.  Lymphadenopathy:    He has no cervical adenopathy.  Neurological: He is alert and oriented to person, place, and time. No sensory deficit. He exhibits normal muscle tone.  Skin: Skin is warm and dry. Capillary refill takes less than 2 seconds. No rash noted.  Psychiatric: He has a normal mood and affect. His behavior is normal.  Vitals reviewed.    ASSESSMENT & PLAN: Bob Fowler was seen today for headache.  Diagnoses and all orders for this visit:  Viral illness -     CBC with Differential/Platelet -     Comprehensive metabolic panel -     HIV antibody -     Sedimentation  Rate  Intermittent fever -     CBC with Differential/Platelet -     Comprehensive metabolic panel -     HIV antibody -     Sedimentation Rate  Sweaty skin -     CBC with Differential/Platelet -     Comprehensive metabolic panel -     HIV antibody -     Sedimentation Rate    Patient Instructions       IF you received an x-ray today, you will receive an invoice from [facility]. Please contact [facility] at [phone] with questions or concerns regarding your invoice.   IF you received labwork today, you will receive an invoice from [facility]. Please contact [facility] at [phone] with questions or concerns regarding your invoice.   Our billing staff will not be able to assist you with questions regarding bills from these companies.  You will be contacted with the lab results as soon as they are available. The fastest way to get your results is to activate your My Chart account. Instructions are located on the last page of this paperwork. If you have not heard from [place] regarding the results in 2 weeks, please contact this office.     Viral Illness, Adult Viruses are tiny germs that can get into a person's body and cause illness. There are many different types of viruses, and they cause many types of illness. Viral illnesses can range from mild to severe. They can affect various parts of the body. Common illnesses that are caused by a virus include colds and the flu. Viral illnesses also include serious conditions such as HIV/AIDS (human immunodeficiency virus/acquired immunodeficiency syndrome). A few viruses have been linked to certain cancers. What are the causes? Many types of viruses can cause illness. Viruses invade cells in your body, multiply, and cause the infected cells to malfunction or die. When the cell dies, it releases more of the virus. When this happens, you develop symptoms of the illness, and the virus continues to spread to other cells. If the  virus takes over the function of the cell, it can cause the cell to divide and grow out of control, as is the case when a virus causes cancer. Different viruses get into the body in different ways. You can get a virus by:  Swallowing food or water that is contaminated with the virus.  Breathing in droplets that have been coughed or sneezed into the air by an infected person.  Touching a surface that has been contaminated with the virus and then touching your eyes, nose, or mouth.  Being bitten by an insect or animal that carries the virus.  Having sexual contact with a person who is infected with the virus.  Being exposed to blood or fluids that contain the virus, either through an open cut or during a transfusion.  If a virus enters your body, your  body's defense system (immune system) will try to fight the virus. You may be at higher risk for a viral illness if your immune system is weak. What are the signs or symptoms? Symptoms vary depending on the type of virus and the location of the cells that it invades. Common symptoms of the main types of viral illnesses include: Cold and flu viruses  Fever.  Headache.  Sore throat.  Muscle aches.  Nasal congestion.  Cough. Digestive system (gastrointestinal) viruses  Fever.  Abdominal pain.  Nausea.  Diarrhea. Liver viruses (hepatitis)  Loss of appetite.  Tiredness.  Yellowing of the skin (jaundice). Brain and spinal cord viruses  Fever.  Headache.  Stiff neck.  Nausea and vomiting.  Confusion or sleepiness. Skin viruses  Warts.  Itching.  Rash. Sexually transmitted viruses  Discharge.  Swelling.  Redness.  Rash. How is this treated? Viruses can be difficult to treat because they live within cells. Antibiotic medicines do not treat viruses because these drugs do not get inside cells. Treatment for a viral illness may include:  Resting and drinking plenty of fluids.  Medicines to relieve  symptoms. These can include over-the-counter medicine for pain and fever, medicines for cough or congestion, and medicines to relieve diarrhea.  Antiviral medicines. These drugs are available only for certain types of viruses. They may help reduce flu symptoms if taken early. There are also many antiviral medicines for hepatitis and HIV/AIDS.  Some viral illnesses can be prevented with vaccinations. A common example is the flu shot. Follow these instructions at home: Medicines   Take over-the-counter and prescription medicines only as told by your health care provider.  If you were prescribed an antiviral medicine, take it as told by your health care provider. Do not stop taking the medicine even if you start to feel better.  Be aware of when antibiotics are needed and when they are not needed. Antibiotics do not treat viruses. If your health care provider thinks that you may have a bacterial infection as well as a viral infection, you may get an antibiotic. ? Do not ask for an antibiotic prescription if you have been diagnosed with a viral illness. That will not make your illness go away faster. ? Frequently taking antibiotics when they are not needed can lead to antibiotic resistance. When this develops, the medicine no longer works against the bacteria that it normally fights. General instructions  Drink enough fluids to keep your urine clear or pale yellow.  Rest as much as possible.  Return to your normal activities as told by your health care provider. Ask your health care provider what activities are safe for you.  Keep all follow-up visits as told by your health care provider. This is important. How is this prevented? Take these actions to reduce your risk of viral infection:  Eat a healthy diet and get enough rest.  Wash your hands often with soap and water. This is especially important when you are in public places. If soap and water are not available, use hand  sanitizer.  Avoid close contact with friends and family who have a viral illness.  If you travel to areas where viral gastrointestinal infection is common, avoid drinking water or eating raw food.  Keep your immunizations up to date. Get a flu shot every year as told by your health care provider.  Do not share toothbrushes, nail clippers, razors, or needles with other people.  Always practice safe sex.  Contact a health care provider if:  You have symptoms of a viral illness that do not go away.  Your symptoms come back after going away.  Your symptoms get worse. Get help right away if:  You have trouble breathing.  You have a severe headache or a stiff neck.  You have severe vomiting or abdominal pain. This information is not intended to replace advice given to you by your health care provider. Make sure you discuss any questions you have with your health care provider. Document Released: 06/26/2015 Document Revised: 07/29/2015 Document Reviewed: 06/26/2015 Elsevier Interactive Patient Education  2018 Elsevier Inc.      Edwina Barth, MD [facility]

## 2016-12-08 LAB — CBC WITH DIFFERENTIAL/PLATELET
BASOS ABS: 0 10*3/uL (ref 0.0–0.2)
Basos: 0 %
EOS (ABSOLUTE): 0.3 10*3/uL (ref 0.0–0.4)
EOS: 5 %
HEMOGLOBIN: 13.8 g/dL (ref 13.0–17.7)
Hematocrit: 40.5 % (ref 37.5–51.0)
IMMATURE GRANS (ABS): 0 10*3/uL (ref 0.0–0.1)
Immature Granulocytes: 0 %
Lymphocytes Absolute: 1.2 10*3/uL (ref 0.7–3.1)
Lymphs: 23 %
MCH: 26.5 pg — ABNORMAL LOW (ref 26.6–33.0)
MCHC: 34.1 g/dL (ref 31.5–35.7)
MCV: 78 fL — ABNORMAL LOW (ref 79–97)
Monocytes Absolute: 0.3 10*3/uL (ref 0.1–0.9)
Monocytes: 6 %
NEUTROS ABS: 3.4 10*3/uL (ref 1.4–7.0)
Neutrophils: 66 %
Platelets: 251 10*3/uL (ref 150–379)
RBC: 5.2 x10E6/uL (ref 4.14–5.80)
RDW: 13.4 % (ref 12.3–15.4)
WBC: 5.3 10*3/uL (ref 3.4–10.8)

## 2016-12-08 LAB — COMPREHENSIVE METABOLIC PANEL
ALBUMIN: 4.3 g/dL (ref 3.5–5.5)
ALT: 16 IU/L (ref 0–44)
AST: 19 IU/L (ref 0–40)
Albumin/Globulin Ratio: 1.4 (ref 1.2–2.2)
Alkaline Phosphatase: 91 IU/L (ref 39–117)
BUN / CREAT RATIO: 14 (ref 9–20)
BUN: 13 mg/dL (ref 6–20)
Bilirubin Total: <0.2 mg/dL (ref 0.0–1.2)
CO2: 22 mmol/L (ref 20–29)
CREATININE: 0.91 mg/dL (ref 0.76–1.27)
Calcium: 9.5 mg/dL (ref 8.7–10.2)
Chloride: 102 mmol/L (ref 96–106)
GFR, EST AFRICAN AMERICAN: 135 mL/min/{1.73_m2} (ref >59)
GFR, EST NON AFRICAN AMERICAN: 117 mL/min/{1.73_m2} (ref >59)
GLUCOSE: 88 mg/dL (ref 65–99)
Globulin, Total: 3.1 g/dL (ref 1.5–4.5)
Potassium: 4.8 mmol/L (ref 3.5–5.2)
Sodium: 142 mmol/L (ref 134–144)
TOTAL PROTEIN: 7.4 g/dL (ref 6.0–8.5)

## 2016-12-08 LAB — HIV ANTIBODY (ROUTINE TESTING W REFLEX): HIV Screen 4th Generation wRfx: NONREACTIVE

## 2016-12-08 LAB — SEDIMENTATION RATE: Sed Rate: 7 mm/hr (ref 0–15)

## 2016-12-15 ENCOUNTER — Telehealth: Payer: Self-pay | Admitting: *Deleted

## 2016-12-15 ENCOUNTER — Encounter: Payer: Self-pay | Admitting: *Deleted

## 2016-12-15 NOTE — Telephone Encounter (Signed)
Normal lab letter sent

## 2017-03-14 DIAGNOSIS — Z79899 Other long term (current) drug therapy: Secondary | ICD-10-CM | POA: Diagnosis not present

## 2017-05-09 ENCOUNTER — Other Ambulatory Visit: Payer: Self-pay

## 2017-05-09 ENCOUNTER — Ambulatory Visit: Payer: BLUE CROSS/BLUE SHIELD | Admitting: Family Medicine

## 2017-05-09 ENCOUNTER — Encounter: Payer: Self-pay | Admitting: Family Medicine

## 2017-05-09 VITALS — BP 120/60 | HR 90 | Temp 97.9°F | Ht 71.0 in | Wt 220.4 lb

## 2017-05-09 DIAGNOSIS — R61 Generalized hyperhidrosis: Secondary | ICD-10-CM

## 2017-05-09 DIAGNOSIS — R5383 Other fatigue: Secondary | ICD-10-CM | POA: Diagnosis not present

## 2017-05-09 DIAGNOSIS — R110 Nausea: Secondary | ICD-10-CM | POA: Diagnosis not present

## 2017-05-09 DIAGNOSIS — R5381 Other malaise: Secondary | ICD-10-CM

## 2017-05-09 NOTE — Progress Notes (Signed)
3/12/201911:36 AM  Bob Fowler [birth year], 27 y.o. male [id number]  Chief Complaint  Patient presents with  . Fatigue    having flu like symptoms for the past month. This feeling is very randon upon onset.     HPI:   Patient is a 27 y.o. male who presents today for about a month of recurring episodes of malaise, fatigue, gets really hot, breaks a sweat, nausea that then resolves. He states that in between episodes he feels in perfect health. He denies any coughing, SOB, ear pain, sore throat, chest pain, palpitations, abd pain, vomiting, diarrhea or rashes associated with these episodes. He denies any swollen lymph nodes. Last HIV test in oct 2018 was non-reactive.  Depression screen [employer] 2/9 05/09/2017 12/07/2016 11/03/2016  Decreased Interest 0 0 0  Down, Depressed, Hopeless 0 0 0  PHQ - 2 Score 0 0 0    No Known Allergies  Prior to Admission medications   Medication Sig Start Date End Date Taking? Authorizing Provider  lamoTRIgine (LAMICTAL) 25 MG tablet Take 25 mg by mouth daily.   Yes [provider]    Past Medical History:  Diagnosis Date  . Anxiety   . Substance abuse (HCC)     History reviewed. No pertinent surgical history.  Social History   Tobacco Use  . Smoking status: Current Every Day Smoker    Packs/day: 1.00    Types: Cigarettes  . Smokeless tobacco: Never Used  Substance Use Topics  . Alcohol use: Yes    Alcohol/week: 0.0 oz    Family History  Problem Relation Age of Onset  . Diabetes Mother   . Mental illness Maternal Grandmother   . Heart disease Maternal Grandfather     ROS Per hpi  OBJECTIVE:  Blood pressure 120/60, pulse 90, temperature 97.9 F (36.6 C), temperature source Oral, height 5\' 11"  (1.803 m), weight 220 lb 6.4 oz (100 kg), SpO2 98 %.  Physical Exam  Constitutional: He is oriented to person, place, and time and well-developed, well-nourished, and in no distress.  HENT:  Head: Normocephalic and atraumatic.    Right Ear: Hearing, tympanic membrane, external ear and ear canal normal.  Left Ear: Hearing, tympanic membrane, external ear and ear canal normal.  Mouth/Throat: Oropharynx is clear and moist. No oropharyngeal exudate.  Eyes: Conjunctivae and EOM are normal. Pupils are equal, round, and reactive to light.  Neck: Neck supple. No thyromegaly present.  Cardiovascular: Normal rate, regular rhythm, normal heart sounds and intact distal pulses. Exam reveals no gallop and no friction rub.  No murmur heard. Pulmonary/Chest: Effort normal and breath sounds normal. He has no wheezes. He has no rales.  Abdominal: Soft. Bowel sounds are normal. He exhibits no distension and no mass. There is no tenderness.  Musculoskeletal: Normal range of motion. He exhibits no edema.  Lymphadenopathy:    He has no cervical adenopathy.  Neurological: He is alert and oriented to person, place, and time. He has normal reflexes. No cranial nerve deficit. Gait normal.  Skin: Skin is warm and dry.  Psychiatric: Mood and affect normal.     ASSESSMENT and PLAN  1. Malaise and fatigue Discussed that symptoms seem to be related to eating at work, wondering about a mild allergic reaction. Labs as ordered. Discussed trial of not eating at work and see if symptoms resolved. RTC precautions reviewed. - CBC with Differential/Platelet - Comprehensive metabolic panel - TSH - HIV antibody - Sedimentation Rate - C-reactive protein  2. Night sweats -  CBC with Differential/Platelet - Comprehensive metabolic panel - TSH - HIV antibody - Sedimentation Rate - C-reactive protein  3. Nausea without vomiting - CBC with Differential/Platelet - Comprehensive metabolic panel - TSH - HIV antibody - Sedimentation Rate - C-reactive protein  Return in about 2 weeks (around 05/23/2017).    Myles LippsIrma M Santiago, MD Primary Care at [facility] [address] [place], [place] [address] Ph.  [phone] Fax [phone]

## 2017-05-09 NOTE — Patient Instructions (Addendum)
1. Trial of not eating at work for 2 weeks and see if symptoms resolved, if not or if worsening please return    IF you received an x-ray today, you will receive an invoice from [facility]. Please contact [facility] at [phone] with questions or concerns regarding your invoice.   IF you received labwork today, you will receive an invoice from [facility]. Please contact [facility] at [phone] with questions or concerns regarding your invoice.   Our billing staff will not be able to assist you with questions regarding bills from these companies.  You will be contacted with the lab results as soon as they are available. The fastest way to get your results is to activate your My Chart account. Instructions are located on the last page of this paperwork. If you have not heard from us regarding the results in 2 weeks, please contact this office.

## 2017-05-10 ENCOUNTER — Other Ambulatory Visit: Payer: Self-pay | Admitting: Family Medicine

## 2017-05-10 ENCOUNTER — Telehealth: Payer: Self-pay | Admitting: Family Medicine

## 2017-05-10 DIAGNOSIS — R945 Abnormal results of liver function studies: Secondary | ICD-10-CM

## 2017-05-10 DIAGNOSIS — R509 Fever, unspecified: Secondary | ICD-10-CM

## 2017-05-10 DIAGNOSIS — R7989 Other specified abnormal findings of blood chemistry: Secondary | ICD-10-CM

## 2017-05-10 LAB — COMPREHENSIVE METABOLIC PANEL
ALT: 550 IU/L (ref 0–44)
AST: 327 IU/L — ABNORMAL HIGH (ref 0–40)
Albumin/Globulin Ratio: 1.4 (ref 1.2–2.2)
Albumin: 4.5 g/dL (ref 3.5–5.5)
Alkaline Phosphatase: 166 IU/L — ABNORMAL HIGH (ref 39–117)
BUN/Creatinine Ratio: 12 (ref 9–20)
BUN: 10 mg/dL (ref 6–20)
Bilirubin Total: 0.4 mg/dL (ref 0.0–1.2)
CO2: 26 mmol/L (ref 20–29)
Calcium: 9.8 mg/dL (ref 8.7–10.2)
Chloride: 100 mmol/L (ref 96–106)
Creatinine, Ser: 0.82 mg/dL (ref 0.76–1.27)
GFR calc Af Amer: 141 mL/min/{1.73_m2} (ref >59)
GFR calc non Af Amer: 122 mL/min/{1.73_m2} (ref >59)
Globulin, Total: 3.2 g/dL (ref 1.5–4.5)
Glucose: 84 mg/dL (ref 65–99)
Potassium: 5 mmol/L (ref 3.5–5.2)
Sodium: 143 mmol/L (ref 134–144)
Total Protein: 7.7 g/dL (ref 6.0–8.5)

## 2017-05-10 LAB — CBC WITH DIFFERENTIAL/PLATELET
Basophils Absolute: 0 10*3/uL (ref 0.0–0.2)
Basos: 1 %
EOS (ABSOLUTE): 0.3 10*3/uL (ref 0.0–0.4)
Eos: 5 %
Hematocrit: 43.8 % (ref 37.5–51.0)
Hemoglobin: 14.3 g/dL (ref 13.0–17.7)
Immature Grans (Abs): 0 10*3/uL (ref 0.0–0.1)
Immature Granulocytes: 1 %
Lymphocytes Absolute: 1.5 10*3/uL (ref 0.7–3.1)
Lymphs: 24 %
MCH: 25.9 pg — ABNORMAL LOW (ref 26.6–33.0)
MCHC: 32.6 g/dL (ref 31.5–35.7)
MCV: 79 fL (ref 79–97)
Monocytes Absolute: 0.5 10*3/uL (ref 0.1–0.9)
Monocytes: 8 %
Neutrophils Absolute: 3.9 10*3/uL (ref 1.4–7.0)
Neutrophils: 61 %
Platelets: 307 10*3/uL (ref 150–379)
RBC: 5.52 x10E6/uL (ref 4.14–5.80)
RDW: 14.3 % (ref 12.3–15.4)
WBC: 6.4 10*3/uL (ref 3.4–10.8)

## 2017-05-10 LAB — SEDIMENTATION RATE: Sed Rate: 3 mm/hr (ref 0–15)

## 2017-05-10 LAB — HIV ANTIBODY (ROUTINE TESTING W REFLEX): HIV Screen 4th Generation wRfx: NONREACTIVE

## 2017-05-10 LAB — C-REACTIVE PROTEIN: CRP: 12.5 mg/L — ABNORMAL HIGH (ref 0.0–4.9)

## 2017-05-10 LAB — TSH: TSH: 0.811 u[IU]/mL (ref 0.450–4.500)

## 2017-05-10 NOTE — Telephone Encounter (Signed)
Called pt and had to leave vm to call our office back to get U/S scheduled that Dr. Leretha PolSantiago ordered for him. I also let him know Dr. Leretha PolSantiago wanted to speak with him regarding the U/S when he calls back. Thanks.

## 2017-05-10 NOTE — Telephone Encounter (Signed)
Patient is returning phone call. Please advise.  [phone]

## 2017-05-10 NOTE — Telephone Encounter (Signed)
I spoke with patient.

## 2017-05-11 ENCOUNTER — Ambulatory Visit (HOSPITAL_COMMUNITY)
Admission: RE | Admit: 2017-05-11 | Discharge: 2017-05-11 | Disposition: A | Discharge status: Home / Self Care | Payer: BLUE CROSS/BLUE SHIELD | Source: Ambulatory Visit | Attending: Family Medicine | Admitting: Family Medicine

## 2017-05-11 ENCOUNTER — Ambulatory Visit: Payer: BLUE CROSS/BLUE SHIELD | Admitting: Family Medicine

## 2017-05-11 DIAGNOSIS — Z79899 Other long term (current) drug therapy: Secondary | ICD-10-CM | POA: Diagnosis not present

## 2017-05-11 DIAGNOSIS — R7989 Other specified abnormal findings of blood chemistry: Secondary | ICD-10-CM

## 2017-05-11 DIAGNOSIS — R509 Fever, unspecified: Secondary | ICD-10-CM | POA: Diagnosis not present

## 2017-05-11 DIAGNOSIS — R945 Abnormal results of liver function studies: Secondary | ICD-10-CM

## 2017-05-12 ENCOUNTER — Encounter: Payer: Self-pay | Admitting: Physician Assistant

## 2017-05-12 ENCOUNTER — Other Ambulatory Visit: Payer: Self-pay

## 2017-05-12 ENCOUNTER — Ambulatory Visit: Payer: BLUE CROSS/BLUE SHIELD | Admitting: Physician Assistant

## 2017-05-12 ENCOUNTER — Telehealth: Payer: Self-pay

## 2017-05-12 VITALS — BP 120/68 | HR 75 | Temp 97.7°F | Resp 17 | Ht 71.0 in | Wt 224.0 lb

## 2017-05-12 DIAGNOSIS — B1710 Acute hepatitis C without hepatic coma: Secondary | ICD-10-CM

## 2017-05-12 LAB — COMPREHENSIVE METABOLIC PANEL
ALT: 530 IU/L (ref 0–44)
AST: 312 IU/L — ABNORMAL HIGH (ref 0–40)
Albumin/Globulin Ratio: 1.4 (ref 1.2–2.2)
Albumin: 4.3 g/dL (ref 3.5–5.5)
Alkaline Phosphatase: 173 IU/L — ABNORMAL HIGH (ref 39–117)
BUN/Creatinine Ratio: 13 (ref 9–20)
BUN: 11 mg/dL (ref 6–20)
Bilirubin Total: 0.7 mg/dL (ref 0.0–1.2)
CO2: 27 mmol/L (ref 20–29)
Calcium: 8.9 mg/dL (ref 8.7–10.2)
Chloride: 98 mmol/L (ref 96–106)
Creatinine, Ser: 0.86 mg/dL (ref 0.76–1.27)
GFR calc Af Amer: 138 mL/min/{1.73_m2} (ref >59)
GFR calc non Af Amer: 120 mL/min/{1.73_m2} (ref >59)
Globulin, Total: 3.1 g/dL (ref 1.5–4.5)
Glucose: 97 mg/dL (ref 65–99)
Potassium: 5 mmol/L (ref 3.5–5.2)
Sodium: 139 mmol/L (ref 134–144)
Total Protein: 7.4 g/dL (ref 6.0–8.5)

## 2017-05-12 LAB — ACUTE HEP PANEL AND HEP B SURFACE AB
Hep A IgM: NEGATIVE
Hep B C IgM: NEGATIVE
Hep C Virus Ab: 3.4 s/co ratio — ABNORMAL HIGH (ref 0.0–0.9)
Hepatitis B Surf Ab Quant: 10.2 m[IU]/mL (ref >9.9)
Hepatitis B Surface Ag: NEGATIVE

## 2017-05-12 LAB — FERRITIN: Ferritin: 369 ng/mL (ref 30–400)

## 2017-05-12 LAB — LIPASE: Lipase: 16 U/L (ref 13–78)

## 2017-05-12 LAB — AMYLASE: Amylase: 36 U/L (ref 31–124)

## 2017-05-12 LAB — ANA W/REFLEX IF POSITIVE: Anti Nuclear Antibody(ANA): NEGATIVE

## 2017-05-12 NOTE — Progress Notes (Signed)
05/12/2017 2:20 PM   DOB: 1990-07-23 / MRN: [id number]  SUBJECTIVE:  Bob Fowler is a 27 y.o. male presenting for discussion of labs.  His knowledge of the problem this far consist of being evaluated for vague complaints of fatigue.  He knows his liver enzymes are elevated and he did go for the ultrasound yesterday.  He is unaware of positive hepatitis C lab.  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  He tells me that he did have negative hepatitis C labs in October 2018.  He currently denies any pain.  The cold weather is not really bothering him at this time.  He denies rash yellowing of the skin.  He is having normal bowel movements.  He does have a psychiatrist whom he sees every 2 weeks for his history of anxiety and substance abuse disorder.  He does not want to use.   There is no immunization history on file for this patient.   He has no allergies on file.   He  has a past medical history of Anxiety and Substance abuse (HCC).    He  reports that he has been smoking cigarettes.  He has been smoking about 1.00 pack per day. he has never used smokeless tobacco. He reports that he drinks alcohol. He reports that he uses drugs. Drug: Marijuana. He  has no sexual activity history on file. The patient  has no past surgical history on file.  His family history includes Diabetes in his mother; Heart disease in his maternal grandfather; Mental illness in his maternal grandmother.  ROS  The problem list and medications were reviewed and updated by myself where necessary and exist elsewhere in the encounter.   OBJECTIVE:  BP 120/68 (BP Location: Left Arm, Patient Position: Sitting, Cuff Size: Large)   Pulse 75   Temp 97.7 F (36.5 C) (Oral)   Resp 17   Ht 5\' 11"  (1.803 m)   Wt 224 lb (101.6 kg)   SpO2 98%   BMI 31.24 kg/m   Physical Exam  Constitutional: He appears well-developed. He is active and cooperative.  Non-toxic appearance.  Eyes:    Anicteric  Cardiovascular: Normal rate.  Pulmonary/Chest: Effort normal. No tachypnea.  Neurological: He is alert.  Skin: Skin is warm and dry. No rash noted. He is not diaphoretic. No pallor.  Vitals reviewed.   Results for orders placed or performed in visit on 05/11/17 (from the past 72 hour(s))  Amylase     Status: None   Collection Time: 05/11/17  5:19 PM  Result Value Ref Range   Amylase 36 31 - 124 U/L  Lipase     Status: None   Collection Time: 05/11/17  5:19 PM  Result Value Ref Range   Lipase 16 13 - 78 U/L  Comprehensive metabolic panel     Status: Abnormal   Collection Time: 05/11/17  5:19 PM  Result Value Ref Range   Glucose 97 65 - 99 mg/dL   BUN 11 6 - 20 mg/dL   Creatinine, Ser 4.09 0.76 - 1.27 mg/dL   GFR calc non Af Amer 120 >59 mL/min/1.73   GFR calc Af Amer 138 >59 mL/min/1.73   BUN/Creatinine Ratio 13 9 - 20   Sodium 139 134 - 144 mmol/L   Potassium 5.0 3.5 - 5.2 mmol/L   Chloride 98 96 - 106 mmol/L   CO2 27 20 - 29 mmol/L   Calcium 8.9 8.7 -  10.2 mg/dL   Total Protein 7.4 6.0 - 8.5 g/dL   Albumin 4.3 3.5 - 5.5 g/dL   Globulin, Total 3.1 1.5 - 4.5 g/dL   Albumin/Globulin Ratio 1.4 1.2 - 2.2   Bilirubin Total 0.7 0.0 - 1.2 mg/dL   Alkaline Phosphatase 173 (H) 39 - 117 IU/L   AST 312 (H) 0 - 40 IU/L   ALT 530 (HH) 0 - 44 IU/L  ANA w/Reflex if Positive     Status: None   Collection Time: 05/11/17  5:19 PM  Result Value Ref Range   Anit Nuclear Antibody(ANA) Negative Negative  Ferritin     Status: None   Collection Time: 05/11/17  5:19 PM  Result Value Ref Range   Ferritin 369 30 - 400 ng/mL  Acute Hep Panel & Hep B Surface Ab     Status: Abnormal   Collection Time: 05/11/17  5:19 PM  Result Value Ref Range   Hep A IgM Negative Negative   Hepatitis B Surface Ag Negative Negative   Hep B C IgM Negative Negative   Hepatitis B Surf Ab Quant 10.2 Immunity>9.9 mIU/mL    Comment: **Verified by repeat analysis**   Status of Immunity                      Anti-HBs Level   ------------------                     -------------- Inconsistent with Immunity                   0.0 - 9.9 Consistent with Immunity                          >9.9    Hep C Virus Ab 3.4 (H) 0.0 - 0.9 s/co ratio    Comment:                                   Negative:     < 0.8                              Indeterminate: 0.8 - 0.9                                   Positive:     > 0.9  The CDC recommends that a positive HCV antibody result  be followed up with a HCV Nucleic Acid Amplification  test ([id number]).     No results found.  ASSESSMENT AND PLAN:  Bob Fowler was seen today for lab work / ultrasound.  Diagnoses and all orders for this visit:  Acute hepatitis C virus infection without hepatic coma: Asymptomatic at this time.  I have gone over all of his labs and his ultrasound with him.  He understands that infectious disease will be calling to schedule appointment.  He understands that he should avoid Tylenol, alcohol, exquisitely sweet foods in abundance.  He is very grateful for the care that he is received over the last week.  I have referred him to Dr. Ronnie Derbyromer infectious disease and look forward learning about his treatment plan. -     Ambulatory referral to Infectious Disease  Other orders -     Cancel: Flu Vaccine  QUAD 36+ mos IM    The patient is advised to call or return to clinic if he does not see an improvement in symptoms, or to seek the care of the closest emergency department if he worsens with the above plan.   Deliah Boston, MHS, PA-C Primary Care at [facility] 05/12/2017 2:20 PM

## 2017-05-12 NOTE — Patient Instructions (Addendum)
  Okay to take small dose of ibuprofen 200.  Avoid tylenol.  Avoid alcohol.  Avoid sweets.  If you symptoms change come back in.      IF you received an x-ray today, you will receive an invoice from [facility]. Please contact [facility] at [phone] with questions or concerns regarding your invoice.   IF you received labwork today, you will receive an invoice from [facility]. Please contact [facility] at [phone] with questions or concerns regarding your invoice.   Our billing staff will not be able to assist you with questions regarding bills from these companies.  You will be contacted with the lab results as soon as they are available. The fastest way to get your results is to activate your My Chart account. Instructions are located on the last page of this paperwork. If you have not heard from us regarding the results in 2 weeks, please contact this office.

## 2017-05-12 NOTE — Telephone Encounter (Signed)
CRITICAL LABS called this am  ALT 530 AST 312 ALK Ph 173  Bob Fowler reviewed and saw we are awaiting Acute Hep Panel & Hep B.

## 2017-05-16 ENCOUNTER — Encounter: Payer: Self-pay | Admitting: Internal Medicine

## 2017-05-16 ENCOUNTER — Ambulatory Visit (INDEPENDENT_AMBULATORY_CARE_PROVIDER_SITE_OTHER): Payer: BLUE CROSS/BLUE SHIELD | Admitting: Internal Medicine

## 2017-05-16 DIAGNOSIS — B182 Chronic viral hepatitis C: Secondary | ICD-10-CM | POA: Diagnosis not present

## 2017-05-16 NOTE — Progress Notes (Signed)
[facility]   CC: consideration for treatment for chronic hepatitis C  HPI:  +Bob Fowler is a 27 y.o. male who presents for initial evaluation and management of chronic hepatitis C.  Patient tested positive earlier this month with a positive antibody. Hepatitis C-associated risk factors present are: IV drug abuse (details: last used over 6 months ago). Patient denies sexual contact with person with liver disease. Patient has not had other studies performed. Results: positive antibody only at this time. Patient has not had prior treatment for Hepatitis C. Patient does not have a past history of liver disease. Patient does not have a family history of liver disease. Patient does not  have associated signs or symptoms related to liver disease.   Records reviewed from Epic and no previous test of hepatitis C noted.       Patient does not have documented immunity to Hepatitis A. Patient does have documented immunity to Hepatitis B.    Review of Systems:  Constitutional: negative for fatigue and malaise Gastrointestinal: negative for diarrhea Integument/breast: negative for rash All other systems reviewed and are negative       Past Medical History:  Diagnosis Date  . Anxiety   . Substance abuse (HCC)     Prior to Admission medications   Medication Sig Start Date End Date Taking? Authorizing Provider  lamoTRIgine (LAMICTAL) 25 MG tablet Take 25 mg by mouth daily.    [provider]    No Known Allergies  Social History   Tobacco Use  . Smoking status: Current Every Day Smoker    Packs/day: 1.00    Types: Cigarettes  . Smokeless tobacco: Never Used  Substance Use Topics  . Alcohol use: Yes    Alcohol/week: 0.0 oz  . Drug use: Yes    Types: Marijuana    Family History  Problem Relation Age of Onset  . Diabetes Mother   . Mental illness Maternal Grandmother   . Heart disease Maternal Grandfather      Objective:  Constitutional:  in no apparent distress,  Vitals:   05/16/17 1011  BP: 115/71  Pulse: 64  Temp: 98.1 F (36.7 C)   Eyes: anicteric Cardiovascular: Cor RRR Respiratory: CTA B; normal respiratory effort Gastrointestinal: Bowel sounds are normal, liver is not enlarged, spleen is not enlarged Musculoskeletal: no pedal edema noted Skin: negatives: no rash; no porphyria cutanea tarda Lymphatic: no cervical lymphadenopathy   Laboratory Genotype: No results found for: HCVGENOTYPE HCV viral load: No results found for: HCVQUANT Lab Results  Component Value Date   WBC 6.4 05/09/2017   HGB 14.3 05/09/2017   HCT 43.8 05/09/2017   MCV 79 05/09/2017   PLT 307 05/09/2017    Lab Results  Component Value Date   CREATININE 0.86 05/11/2017   BUN 11 05/11/2017   NA 139 05/11/2017   K 5.0 05/11/2017   CL 98 05/11/2017   CO2 27 05/11/2017    Lab Results  Component Value Date   ALT 530 (HH) 05/11/2017   AST 312 (H) 05/11/2017   ALKPHOS 173 (H) 05/11/2017     Labs and history reviewed and show CHILD-PUGH unknown  5-6 points: Child class A 7-9 points: Child class B 10-15 points: Child class C  Lab Results  Component Value Date   BILITOT 0.7 05/11/2017   ALBUMIN 4.3 05/11/2017     Assessment: New Patient with Chronic Hepatitis C genotype unknown, untreated.  I discussed with the patient the lab findings  that confirm chronic hepatitis C as well as the natural history and progression of disease including about 30% of people who develop cirrhosis of the liver if left untreated and once cirrhosis is established there is a 2-7% risk per year of liver cancer and liver failure.  I discussed the importance of treatment and benefits in reducing the risk, even if significant liver fibrosis exists.   Plan: 1) Patient counseled extensively on limiting acetaminophen to no more than 2 grams daily, avoidance of alcohol. 2) Transmission discussed with patient including sexual transmission, sharing razors and  toothbrush.   3) Will need referral to gastroenterology if concern for cirrhosis 4) Will need referral for substance abuse counseling: No.; Further work up to include urine drug screen  No. 5) will check a viral load to confirm if active chronic hepatitis C. I discussed with him that 20% of patients with a positive Ab do spontaneously clear the virus without need for treatment.  Since he last used IVD over 6 months ago, if he was in the 20%, it will now be cleared so will check the viral load today and if positive, he will come back for further lab testing, genotype and get the process started for treatment.

## 2017-05-18 ENCOUNTER — Other Ambulatory Visit: Payer: Self-pay | Admitting: Internal Medicine

## 2017-05-18 DIAGNOSIS — B182 Chronic viral hepatitis C: Secondary | ICD-10-CM

## 2017-05-18 LAB — HEPATITIS C RNA QUANTITATIVE
HCV QUANT LOG: 6.91 {Log_IU}/mL — AB
HCV RNA, PCR, QN: 8170000 IU/mL — ABNORMAL HIGH

## 2017-05-19 ENCOUNTER — Telehealth: Payer: Self-pay

## 2017-05-19 NOTE — Telephone Encounter (Signed)
Per Dr. Luciana Axeomer I called to schedule an appt for the pt to get labs done and a follow up appt with Dr. Luciana Axeomer a week after. Left a message for the pt to call back when he can. Lorenso CourierJose L Maldonado, [place]

## 2017-05-19 NOTE — Telephone Encounter (Signed)
-----   Message from Gardiner Barefootobert W Comer, MD sent at 05/18/2017  4:49 PM EDT ----- Please let him know his lab confirms he has active chronic hepatitis C and needs to come back for the rest of the labs as discussed and follow up with me a week or so after getting the labs to discuss treatment options.  thanks

## 2017-05-19 NOTE — Telephone Encounter (Signed)
Pt called regarding a voicemail left to call the office back about some lab results. I informed the pt that according to some labs he had done earlier this year he had test + for chronic Hep C and would need to make a lab appt and follow up at our office to see Dr. Luciana Axeomer to discuss treatment options.  Pt was able to make a lab appt for the upcoming Monday 04/24/17 for labs and a f/u with Dr. Claudie Fishermanome April 11th at 9:15. Pt had no questions during the call. Lorenso CourierJose L Maldonado, [place]

## 2017-05-22 ENCOUNTER — Other Ambulatory Visit: Payer: BLUE CROSS/BLUE SHIELD

## 2017-05-22 DIAGNOSIS — B182 Chronic viral hepatitis C: Secondary | ICD-10-CM

## 2017-05-23 LAB — CBC WITH DIFFERENTIAL/PLATELET
Basophils Absolute: 39 cells/uL (ref 0–200)
Basophils Relative: 0.5 %
EOS PCT: 4.1 %
Eosinophils Absolute: 320 cells/uL (ref 15–500)
HCT: 42.2 % (ref 38.5–50.0)
Hemoglobin: 14.4 g/dL (ref 13.2–17.1)
Lymphs Abs: 1404 cells/uL (ref 850–3900)
MCH: 26.2 pg — ABNORMAL LOW (ref 27.0–33.0)
MCHC: 34.1 g/dL (ref 32.0–36.0)
MCV: 76.9 fL — AB (ref 80.0–100.0)
MONOS PCT: 8.7 %
MPV: 8.9 fL (ref 7.5–12.5)
NEUTROS PCT: 68.7 %
Neutro Abs: 5359 cells/uL (ref 1500–7800)
PLATELETS: 314 10*3/uL (ref 140–400)
RBC: 5.49 10*6/uL (ref 4.20–5.80)
RDW: 14.2 % (ref 11.0–15.0)
TOTAL LYMPHOCYTE: 18 %
WBC: 7.8 10*3/uL (ref 3.8–10.8)
WBCMIX: 679 {cells}/uL (ref 200–950)

## 2017-05-23 LAB — HEPATITIS B SURFACE ANTIBODY,QUALITATIVE: HEP B S AB: BORDERLINE — AB

## 2017-05-23 LAB — COMPLETE METABOLIC PANEL WITH GFR
AG RATIO: 1.3 (calc) (ref 1.0–2.5)
ALT: 250 U/L — AB (ref 9–46)
AST: 149 U/L — AB (ref 10–40)
Albumin: 4.2 g/dL (ref 3.6–5.1)
Alkaline phosphatase (APISO): 142 U/L — ABNORMAL HIGH (ref 40–115)
BUN: 9 mg/dL (ref 7–25)
CALCIUM: 9.2 mg/dL (ref 8.6–10.3)
CHLORIDE: 101 mmol/L (ref 98–110)
CO2: 32 mmol/L (ref 20–32)
Creat: 0.9 mg/dL (ref 0.60–1.35)
GFR, EST NON AFRICAN AMERICAN: 117 mL/min/{1.73_m2} (ref >=60)
GFR, Est African American: 136 mL/min/{1.73_m2} (ref >=60)
Globulin: 3.2 g/dL (calc) (ref 1.9–3.7)
Glucose, Bld: 85 mg/dL (ref 65–99)
POTASSIUM: 4.2 mmol/L (ref 3.5–5.3)
Sodium: 140 mmol/L (ref 135–146)
Total Bilirubin: 0.6 mg/dL (ref 0.2–1.2)
Total Protein: 7.4 g/dL (ref 6.1–8.1)

## 2017-05-23 LAB — HEPATITIS B CORE ANTIBODY, TOTAL: Hep B Core Total Ab: NONREACTIVE

## 2017-05-23 LAB — PROTIME-INR
INR: 1
Prothrombin Time: 10.8 s (ref 9.0–11.5)

## 2017-05-23 LAB — HIV ANTIBODY (ROUTINE TESTING W REFLEX): HIV 1&2 Ab, 4th Generation: NONREACTIVE

## 2017-05-23 LAB — HEPATITIS B SURFACE ANTIGEN: Hepatitis B Surface Ag: NONREACTIVE

## 2017-05-23 LAB — HEPATITIS A ANTIBODY, TOTAL: Hepatitis A AB,Total: NONREACTIVE

## 2017-05-25 LAB — HEPATITIS C GENOTYPE: HCV Genotype: 3

## 2017-05-26 LAB — LIVER FIBROSIS, FIBROTEST-ACTITEST
ALPHA-2-MACROGLOBULIN: 194 mg/dL (ref 106–279)
ALT: 254 U/L — AB (ref 9–46)
Apolipoprotein A1: 81 mg/dL — ABNORMAL LOW (ref 94–176)
Bilirubin: 0.4 mg/dL (ref 0.2–1.2)
Fibrosis Score: 0.24
GGT: 85 U/L — AB (ref 3–70)
HAPTOGLOBIN: 186 mg/dL (ref 43–212)
NECROINFLAMMAT ACT SCORE: 0.85
Reference ID: 2396936

## 2017-06-08 ENCOUNTER — Encounter: Payer: Self-pay | Admitting: Internal Medicine

## 2017-06-08 ENCOUNTER — Ambulatory Visit (INDEPENDENT_AMBULATORY_CARE_PROVIDER_SITE_OTHER): Payer: BLUE CROSS/BLUE SHIELD | Admitting: Internal Medicine

## 2017-06-08 VITALS — BP 126/73 | HR 91 | Temp 98.6°F | Wt 230.0 lb

## 2017-06-08 DIAGNOSIS — B182 Chronic viral hepatitis C: Secondary | ICD-10-CM

## 2017-06-08 DIAGNOSIS — Z23 Encounter for immunization: Secondary | ICD-10-CM | POA: Diagnosis not present

## 2017-06-08 DIAGNOSIS — Z7185 Encounter for immunization safety counseling: Secondary | ICD-10-CM | POA: Insufficient documentation

## 2017-06-08 DIAGNOSIS — Z7189 Other specified counseling: Secondary | ICD-10-CM | POA: Diagnosis not present

## 2017-06-08 DIAGNOSIS — F1121 Opioid dependence, in remission: Secondary | ICD-10-CM

## 2017-06-08 MED ORDER — SOFOSBUVIR-VELPATASVIR 400-100 MG PO TABS: 1.0000 | ORAL_TABLET | Freq: Every day | ORAL | 2 refills | Status: DC

## 2017-06-08 NOTE — Assessment & Plan Note (Signed)
Will start hepatitis A series.

## 2017-06-08 NOTE — Assessment & Plan Note (Signed)
Continues to be drug free.  

## 2017-06-08 NOTE — Progress Notes (Signed)
   Subjective:    Patient ID: Bob Fowler, male    DOB: 12/18/1990, 27 y.o.   MRN: [id number]  HPI Here for follow up of chronic hepatitis C. He was antibody positive and RNA confirmation done last visit.  Labs completed and he has gentoype 3.  Ultrasound done with no mass, no issues.  No associated fatigue.     Review of Systems  Constitutional: Negative for fatigue.  Gastrointestinal: Negative for diarrhea.  Skin: Negative for rash.       Objective:   Physical Exam  Constitutional: He appears well-developed and well-nourished. No distress.  HENT:  Mouth/Throat: No oropharyngeal exudate.  Eyes: No scleral icterus.  Cardiovascular: Normal rate, regular rhythm and normal heart sounds.  No murmur heard. Pulmonary/Chest: Effort normal and breath sounds normal. No respiratory distress.  Skin: No rash noted.   SH: + tobacco       Assessment & Plan:

## 2017-06-08 NOTE — Patient Instructions (Signed)
Date 06/08/17  Dear [name], As discussed in the ID Clinic, your hepatitis C therapy will include the following medications:    sofosbuvir 400 mg/velpatasvir 100 mg (Epclusa) oral daily  Please note that ALL MEDICATIONS WILL START ON THE SAME DATE for a total of 12 weeks. ---------------------------------------------------------------- Your HCV Treatment Start Date: TBA   Your HCV genotype: 3    Liver Fibrosis: F0/1   ---------------------------------------------------------------- YOUR PHARMACY CONTACT (depending on your insurance):   [facility] [address] [place], [place] [address] Phone: [phone] Hours: Monday to Friday 7:30 am to 6:00 pm   Please always contact your pharmacy at least 3-4 business days before you run out of medications to ensure your next month's medication is ready or 1 week prior to running out if you receive it by mail.  Remember, each prescription is for 28 days. ---------------------------------------------------------------- GENERAL NOTES REGARDING YOUR HEPATITIS C MEDICATION:  SOFOSBUVIR (SOVALDI or EPCLUSA): - Sofosbuvir 400 mg tablet is taken daily with OR without food. - The sofosbuvir tablets are yellow. - The Epclusa tablets are pink, diamond-shaped - The tablets should be stored at room temperature. - The most common side effects with sofosbuvir or Epclusa include:      1. Fatigue      2. Headache      3. Nausea      4. Diarrhea      5. Insomnia  - Acid reducing agents such as H2 blockers (ie. Pepcid (famotidine), Zantac (ranitidine), Tagamet (cimetidine), Axid (nizatidine) and proton pump inhibitors (ie. Prilosec (omeprazole), Protonix (pantoprazole), Nexium (esomeprazole), or Aciphex (rabeprazole)) can decrease effectiveness of Harvoni. Do not take until you have discussed with a health care provider.    -Antacids that contain magnesium and/or aluminum hydroxide (ie. Milk of Magensia, Rolaids, Gaviscon, Maalox,  Mylanta, an dArthritis Pain Formula)can reduce absorption of sofosbuvir, so take them at least 4 hours before or after Harvoni.  -Calcium carbonate (calcium supplements or antacids such as Tums, Caltrate, Os-Cal)needs to be taken at least 4 hours hours before or after sofosbuvir.  -St. John's wort or any products that contain St. John's wort like some herbal supplements  Please inform the office prior to starting any of these medications.   Please note that this only lists the most common side effects and is NOT a comprehensive list of the potential side effects of these medications. For more information, please review the drug information sheets that come with your medication package from the pharmacy.  ---------------------------------------------------------------- GENERAL HELPFUL HINTS ON HCV THERAPY: 1. No alcohol. 2. Stay well-hydrated 3. Notify the ID Clinic of any changes in your other over-the-counter/herbal or prescription medications. 4. If you miss a dose of your medication, take the missed dose as soon as you remember. Return to your regular time/dose schedule the next day.  5.  Do not stop taking your medications without first talking with your healthcare provider. 6.  You may take Tylenol (acetaminophen), as long as the dose is less than 2000 mg (OR no more than 4 tablets of the Tylenol Extra Strengths 500mg  tablet) in 24 hours. 7. You will follow up with our clinic pharmacist initially after starting the medication to monitor for any possible side effects 8. You will get labs once during treatment, soon after treatment completion and again 6 months or more after treatment completion to verify the virus is completely gone.   Gardiner Barefootobert W [name], MD  [facility] [address]  [place], [place]  [address] [phone]

## 2017-06-08 NOTE — Addendum Note (Signed)
Addended by: Lurlean LeydenPOOLE, Vanden Fawaz F on: 06/08/2017 09:48 AM   Modules accepted: Orders

## 2017-06-08 NOTE — Assessment & Plan Note (Signed)
Confirmed positive.  With genotype 3 will send for Epclusa .  He will be notified once approved and get follow up visits then.

## 2017-06-13 ENCOUNTER — Telehealth: Payer: Self-pay | Admitting: *Deleted

## 2017-06-13 DIAGNOSIS — Z79899 Other long term (current) drug therapy: Secondary | ICD-10-CM | POA: Diagnosis not present

## 2017-06-13 NOTE — Telephone Encounter (Signed)
Patient called from Suboxone clinic and needs last note and labs. He has signed release and needs fax number. Given and advised once we get the release we will send them what they need. He advised he understands and will try to get this done as soon as possible.

## 2017-06-20 ENCOUNTER — Other Ambulatory Visit: Payer: Self-pay | Admitting: Pharmacist Clinician (PhC)/ Clinical Pharmacy Specialist

## 2017-06-20 MED ORDER — SOFOSBUVIR-VELPATASVIR 400-100 MG PO TABS: 1.0000 | ORAL_TABLET | Freq: Every day | ORAL | 2 refills | Status: DC

## 2017-06-27 DIAGNOSIS — Z79899 Other long term (current) drug therapy: Secondary | ICD-10-CM | POA: Diagnosis not present

## 2017-06-27 NOTE — Telephone Encounter (Signed)
Patient states the clinic did not receive his [facility] office notes/labs. He has an appointment with them at 3. He will come today at 1:30 with the clinic's contact information, will sign a release to have those faxed today. Office notes/labs printed in triage (brown accordion folder). Andree Coss, RN

## 2017-06-28 ENCOUNTER — Telehealth: Payer: Self-pay | Admitting: Behavioral Health

## 2017-06-28 ENCOUNTER — Other Ambulatory Visit: Payer: Self-pay | Admitting: Behavioral Health

## 2017-06-28 NOTE — Telephone Encounter (Signed)
Returned patient's call he left a voicemail.  Patient states he is currently a patient of Dr. Forrestine Him at [facility] being treated with Suboxone.  Patient states Dr. Forrestine Him was unsure how the suboxone would effect his liver while he is taking Epclusa.  Patient has not started taking Epclusa yet.  Called [facility] spoke with French Ana to confirm patients current dose and updated in the medication list. Angeline Slim RN

## 2017-06-28 NOTE — Telephone Encounter (Signed)
-----   Message from Milagros Reap sent at 06/27/2017  4:23 PM EDT ----- Contact: patient Bob Fowler [phone] Patient called has questions on his medications would like a call back

## 2017-06-28 NOTE — Telephone Encounter (Signed)
THanks, no problem taking the suboxone and epclusa together.  He can go ahead and start

## 2017-06-30 NOTE — Telephone Encounter (Signed)
Called Bob Fowler and informed him per Dr. Luciana Axe that it is ok for [name].  Patient verbalized understanding. Patient states Dr. Forrestine Him may call to ask to verify. Angeline Slim RN

## 2017-07-04 ENCOUNTER — Telehealth: Payer: Self-pay | Admitting: Pharmacy Technician

## 2017-07-04 NOTE — Telephone Encounter (Signed)
Bob Fowler's pharmacy has called and mailed a letter about shipping him Hepatiis C medication.  To date, no response.  I gave them his email address.

## 2017-07-10 ENCOUNTER — Encounter: Payer: Self-pay | Admitting: *Deleted

## 2017-07-10 ENCOUNTER — Telehealth: Payer: Self-pay | Admitting: *Deleted

## 2017-07-10 NOTE — Telephone Encounter (Signed)
Yes, write a letter that taking the two medications is ok.  thanks

## 2017-07-10 NOTE — Telephone Encounter (Signed)
Letter in the box waiting for your signature.

## 2017-07-10 NOTE — Telephone Encounter (Signed)
Patient called to ask if Dr Luciana Axe could write a letter to his Rehab facility stating that he can continue to take the Suboxone once he starts his treatment for Hep C. Advised will ask the provider and give him a call back once he responds.

## 2017-07-11 ENCOUNTER — Telehealth: Payer: Self-pay

## 2017-07-11 NOTE — Telephone Encounter (Signed)
Left message for patient letting him know his letter is ready for pick up. Towanda Octave, LPN

## 2017-07-18 DIAGNOSIS — Z79899 Other long term (current) drug therapy: Secondary | ICD-10-CM | POA: Diagnosis not present

## 2017-07-20 ENCOUNTER — Telehealth: Payer: Self-pay | Admitting: Pharmacy Technician

## 2017-07-20 NOTE — Telephone Encounter (Signed)
Avien has medication and is on plane to rehab.  She will help with getting him his refills.  She will make a follow up appt. once he is out of rehab.

## 2017-07-20 NOTE — Telephone Encounter (Signed)
Josef's pharmacy has called multiple times with no return calls about filling his approved Epclusa.  I have left voicemails as well.  His prior authorization is approved through [employer] [place] through July, 12, 2019

## 2017-08-15 DIAGNOSIS — Z79891 Long term (current) use of opiate analgesic: Secondary | ICD-10-CM | POA: Diagnosis not present

## 2017-09-12 DIAGNOSIS — Z79891 Long term (current) use of opiate analgesic: Secondary | ICD-10-CM | POA: Diagnosis not present

## 2017-10-10 DIAGNOSIS — Z79891 Long term (current) use of opiate analgesic: Secondary | ICD-10-CM | POA: Diagnosis not present

## 2017-10-31 DIAGNOSIS — Z79891 Long term (current) use of opiate analgesic: Secondary | ICD-10-CM | POA: Diagnosis not present

## 2017-11-03 DIAGNOSIS — F411 Generalized anxiety disorder: Secondary | ICD-10-CM | POA: Diagnosis not present

## 2017-11-03 DIAGNOSIS — F3340 Major depressive disorder, recurrent, in remission, unspecified: Secondary | ICD-10-CM | POA: Diagnosis not present

## 2017-11-28 DIAGNOSIS — Z79891 Long term (current) use of opiate analgesic: Secondary | ICD-10-CM | POA: Diagnosis not present

## 2017-12-07 ENCOUNTER — Telehealth: Payer: Self-pay | Admitting: *Deleted

## 2017-12-07 NOTE — Telephone Encounter (Signed)
Patient mother called to advise that he has a new job and can not take any time off to get his labs done during our office hours. She asked if he could go to a lab on the weekends and have them done. Advised her that is not out of the ordinary I will just need to have the provider write out what labs he needs and sign the order and he can go when he can. She asked if I could mail it to him. Verified address and advised will mail it once the doctor signs off on it.

## 2017-12-08 NOTE — Telephone Encounter (Signed)
Yes, just do an HCV RNA quantitative and a CMP if he is 3 months out from finishing his HCV medication. thanks

## 2017-12-08 NOTE — Telephone Encounter (Signed)
Rx for labs placed in mail for the patient 12/08/17 with information for the lab to fax results to [place].

## 2017-12-21 ENCOUNTER — Other Ambulatory Visit: Payer: Self-pay

## 2017-12-21 ENCOUNTER — Other Ambulatory Visit: Payer: BLUE CROSS/BLUE SHIELD

## 2017-12-21 DIAGNOSIS — B182 Chronic viral hepatitis C: Secondary | ICD-10-CM

## 2017-12-25 LAB — COMPLETE METABOLIC PANEL WITH GFR
AG Ratio: 1.3 (calc) (ref 1.0–2.5)
ALT: 12 U/L (ref 9–46)
AST: 16 U/L (ref 10–40)
Albumin: 4.2 g/dL (ref 3.6–5.1)
Alkaline phosphatase (APISO): 92 U/L (ref 40–115)
BILIRUBIN TOTAL: 0.4 mg/dL (ref 0.2–1.2)
BUN: 11 mg/dL (ref 7–25)
CHLORIDE: 100 mmol/L (ref 98–110)
CO2: 23 mmol/L (ref 20–32)
Calcium: 9.3 mg/dL (ref 8.6–10.3)
Creat: 0.83 mg/dL (ref 0.60–1.35)
GFR, EST AFRICAN AMERICAN: 141 mL/min/{1.73_m2} (ref >=60)
GFR, Est Non African American: 121 mL/min/{1.73_m2} (ref >=60)
GLUCOSE: 70 mg/dL (ref 65–99)
Globulin: 3.2 g/dL (calc) (ref 1.9–3.7)
POTASSIUM: 4.8 mmol/L (ref 3.5–5.3)
Sodium: 138 mmol/L (ref 135–146)
TOTAL PROTEIN: 7.4 g/dL (ref 6.1–8.1)

## 2017-12-25 LAB — HEPATITIS C RNA QUANTITATIVE
HCV QUANT LOG: DETECTED {Log_IU}/mL — AB
HCV RNA, PCR, QN: <15 IU/mL — AB

## 2017-12-26 DIAGNOSIS — Z79891 Long term (current) use of opiate analgesic: Secondary | ICD-10-CM | POA: Diagnosis not present

## 2018-01-16 DIAGNOSIS — Z79891 Long term (current) use of opiate analgesic: Secondary | ICD-10-CM | POA: Diagnosis not present

## 2018-01-17 ENCOUNTER — Encounter: Payer: Self-pay | Admitting: Internal Medicine

## 2018-01-17 ENCOUNTER — Ambulatory Visit (INDEPENDENT_AMBULATORY_CARE_PROVIDER_SITE_OTHER): Payer: BLUE CROSS/BLUE SHIELD | Admitting: Internal Medicine

## 2018-01-17 VITALS — BP 134/84 | HR 83 | Temp 97.9°F | Ht 71.0 in | Wt 254.0 lb

## 2018-01-17 DIAGNOSIS — F1121 Opioid dependence, in remission: Secondary | ICD-10-CM

## 2018-01-17 DIAGNOSIS — Z23 Encounter for immunization: Secondary | ICD-10-CM

## 2018-01-17 DIAGNOSIS — Z7189 Other specified counseling: Secondary | ICD-10-CM

## 2018-01-17 DIAGNOSIS — B182 Chronic viral hepatitis C: Secondary | ICD-10-CM

## 2018-01-17 DIAGNOSIS — Z7185 Encounter for immunization safety counseling: Secondary | ICD-10-CM

## 2018-01-17 NOTE — Assessment & Plan Note (Signed)
Now considered cured with < 15 SVR12.  Lab does say 'detected' but typically is truly negative.  I will check again today to be sure and no follow up needed unless up again.

## 2018-01-17 NOTE — Assessment & Plan Note (Signed)
He continues to remain drug free °

## 2018-01-17 NOTE — Addendum Note (Signed)
Addended by: Valarie ConesSTALEY, Rishaan Gunner on: 01/17/2018 10:46 AM   Modules accepted: Orders

## 2018-01-17 NOTE — Progress Notes (Signed)
   Subjective:    Patient ID: Verlin FesterDylan Grow, male    DOB: 05/24/1990, 27 y.o.   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  No issues during treament.  Remains drug free and feels well.  No associated fatiuge or rash.    Review of Systems  Constitutional: Negative for fatigue.  Skin: Negative for rash.  Neurological: Negative for headaches.       Objective:   Physical Exam  Constitutional: He appears well-developed and well-nourished. No distress.  HENT:  Mouth/Throat: No oropharyngeal exudate.  Eyes: No scleral icterus.  Cardiovascular: Normal rate, regular rhythm and normal heart sounds.  Pulmonary/Chest: Effort normal and breath sounds normal.   SH: + tobacco       Assessment & Plan:

## 2018-01-17 NOTE — Assessment & Plan Note (Signed)
Discussed hepatitis A and #2 given today 

## 2018-01-19 LAB — HEPATITIS C RNA QUANTITATIVE
HCV Quantitative Log: <1.18 Log IU/mL
HCV RNA, PCR, QN: NOT DETECTED [IU]/mL

## 2018-01-22 ENCOUNTER — Telehealth: Payer: Self-pay | Admitting: Behavioral Health

## 2018-01-22 NOTE — Telephone Encounter (Signed)
-----   Message from Gardiner Barefootobert W Comer, MD sent at 01/22/2018  2:30 PM EST ----- Please let him know his final HCV RNA is not detected, confirming cure.  Thanks!

## 2018-01-22 NOTE — Telephone Encounter (Signed)
[employer]west CommunicationsCalled Bob Fowler, verified identity.  Informed him per Dr. Luciana Axeomer that his Hep C RNA was not detected on recent la work.  Calling him confirming cure.  Patient was appreciative and verbalized understanding. Angeline SlimAshley Bob Shepard RN

## 2018-02-13 DIAGNOSIS — Z79891 Long term (current) use of opiate analgesic: Secondary | ICD-10-CM | POA: Diagnosis not present

## 2018-03-12 ENCOUNTER — Telehealth: Payer: Self-pay

## 2018-03-12 NOTE — Telephone Encounter (Signed)
Patient called request lab report from last office visit with Dr. Luciana Axe on 01/17/2018.  Report printed and place in envelope at the front desk. Patient will come by and pick it up tomorrow

## 2018-03-13 DIAGNOSIS — Z79891 Long term (current) use of opiate analgesic: Secondary | ICD-10-CM | POA: Diagnosis not present

## 2018-04-10 DIAGNOSIS — Z79891 Long term (current) use of opiate analgesic: Secondary | ICD-10-CM | POA: Diagnosis not present

## 2018-05-08 DIAGNOSIS — Z79891 Long term (current) use of opiate analgesic: Secondary | ICD-10-CM | POA: Diagnosis not present

## 2018-05-09 DIAGNOSIS — M79671 Pain in right foot: Secondary | ICD-10-CM | POA: Diagnosis not present

## 2018-05-17 ENCOUNTER — Ambulatory Visit: Payer: BLUE CROSS/BLUE SHIELD | Admitting: Family Medicine

## 2018-05-17 ENCOUNTER — Other Ambulatory Visit: Payer: Self-pay

## 2018-05-17 VITALS — BP 110/67 | HR 80 | Temp 98.2°F | Resp 16 | Ht 71.0 in | Wt 261.2 lb

## 2018-05-17 DIAGNOSIS — J069 Acute upper respiratory infection, unspecified: Secondary | ICD-10-CM | POA: Diagnosis not present

## 2018-05-17 NOTE — Progress Notes (Signed)
Subjective:    Patient ID: Bob Fowler, male    DOB: 06-Jan-1991, 28 y.o.   MRN: [id number]  HPI  Bob Fowler is a 28 y.o. male Presents today for: Chief Complaint  Patient presents with  . Sinusitis    patient stated he has not done any travels to effected stated, no international travel and no contact with anyone who is pos for corona virus. Patient states he has allergies and cough, running nose and sinus pressure for 3 days. Need a note for work stating that is non contagious and he can go back to work   Cough, runny nose started about 3 days ago. No dyspnea. No fever. Productive cough - sinus pressure with lying down. No myalgias, slight HA with sinus pressure.  Father had "allergies" recently, no known other sick contacts.  No recent travel in past 2 weeks, no known exposure to coronavirus infected person. Sent home from work due to symptoms.  No watery eyes or sneezing.  Tx: tylenol severe cold, zyrtec, ibuprofen for foot issue.  No relief with zyrtec.    Patient Active Problem List   Diagnosis Date Noted  . Vaccine counseling 06/08/2017  . Chronic hepatitis C without hepatic coma (HCC) 05/16/2017  . Sweaty skin 12/07/2016  . Tobacco abuse 05/04/2015  . Anxiety 05/04/2015  . History of narcotic addiction (HCC) 08/27/2014   Past Medical History:  Diagnosis Date  . Anxiety   . Substance abuse (HCC)    No past surgical history on file. No Known Allergies Prior to Admission medications   Medication Sig Start Date End Date Taking? Authorizing Provider  Buprenorphine HCl-Naloxone HCl 8-2 MG FILM DISSOLVE FILM UNDER THE TONGUE TWICE DAILY 06/03/17  Yes [provider]   Social History   Socioeconomic History  . Marital status: Single    Spouse name: Not on file  . Number of children: Not on file  . Years of education: Not on file  . Highest education level: Not on file  Occupational History  . Not on file  Social Needs  . Financial resource strain: Not on  file  . Food insecurity:    Worry: Not on file    Inability: Not on file  . Transportation needs:    Medical: Not on file    Non-medical: Not on file  Tobacco Use  . Smoking status: Current Every Day Smoker    Packs/day: 1.00    Types: Cigarettes  . Smokeless tobacco: Never Used  Substance and Sexual Activity  . Alcohol use: Yes    Alcohol/week: 0.0 standard drinks  . Drug use: Yes    Types: Marijuana  . Sexual activity: Not on file  Lifestyle  . Physical activity:    Days per week: Not on file    Minutes per session: Not on file  . Stress: Not on file  Relationships  . Social connections:    Talks on phone: Not on file    Gets together: Not on file    Attends religious service: Not on file    Active member of club or organization: Not on file    Attends meetings of clubs or organizations: Not on file    Relationship status: Not on file  . Intimate partner violence:    Fear of current or ex partner: Not on file    Emotionally abused: Not on file    Physically abused: Not on file    Forced sexual activity: Not on file  Other Topics Concern  .  Not on file  Social History Narrative  . Not on file    Review of Systems Per HPI.     Objective:   Physical Exam Vitals signs reviewed.  Constitutional:      Appearance: He is well-developed.  HENT:     Head: Normocephalic and atraumatic.     Right Ear: Tympanic membrane, ear canal and external ear normal.     Left Ear: Tympanic membrane, ear canal and external ear normal.     Nose: No rhinorrhea.     Mouth/Throat:     Pharynx: No oropharyngeal exudate or posterior oropharyngeal erythema.  Eyes:     Conjunctiva/sclera: Conjunctivae normal.     Pupils: Pupils are equal, round, and reactive to light.  Neck:     Musculoskeletal: Neck supple.  Cardiovascular:     Rate and Rhythm: Normal rate and regular rhythm.     Heart sounds: Normal heart sounds. No murmur.  Pulmonary:     Effort: Pulmonary effort is normal.      Breath sounds: Normal breath sounds. No wheezing, rhonchi or rales.  Abdominal:     Palpations: Abdomen is soft.     Tenderness: There is no abdominal tenderness.  Lymphadenopathy:     Cervical: No cervical adenopathy.  Skin:    General: Skin is warm and dry.     Findings: No rash.  Neurological:     Mental Status: He is alert and oriented to person, place, and time.  Psychiatric:        Behavior: Behavior normal.    Vitals:   05/17/18 1402  BP: 110/67  Pulse: 80  Resp: 16  Temp: 98.2 F (36.8 C)  TempSrc: Oral  SpO2: 97%  Weight: 261 lb 3.2 oz (118.5 kg)  Height:  (1.803 m)      Assessment & Plan:   Bob Fowler is a 28 y.o. male Acute upper respiratory infection  -Allergies versus likely viral upper respiratory infection.  Afebrile, no known coronavirus exposures, low risk, testing not performed.  -Symptomatic care discussed with saline nasal spray, Mucinex for cough.  Did recommend self isolation while he was symptomatic and not to return to work until well.  RTC/ER precautions were discussed for any acute worsening symptoms  No orders of the defined types were placed in this encounter.  Patient Instructions   Saline nasal spray if needed for congestion, mucinex for cough. Rest and avoid others while sick. Do not return to work until well. Return to the clinic or go to the nearest emergency room if any of your symptoms worsen or new symptoms occur.  Upper Respiratory Infection, Adult An upper respiratory infection (URI) affects the nose, throat, and upper air passages. URIs are caused by germs (viruses). The most common type of URI is often called "the common cold." Medicines cannot cure URIs, but you can do things at home to relieve your symptoms. URIs usually get better within 7-10 days. Follow these instructions at home: Activity  Rest as needed.  If you have a fever, stay home from work or school until your fever is gone, or until your doctor says you may  return to work or school. ? You should stay home until you cannot spread the infection anymore (you are not contagious). ? Your doctor may have you wear a face mask so you have less risk of spreading the infection. Relieving symptoms  Gargle with a salt-water mixture 3-4 times a day or as needed. To make a salt-water mixture,  completely dissolve -1 tsp of salt in 1 cup of warm water.  Use a cool-mist humidifier to add moisture to the air. This can help you breathe more easily. Eating and drinking   Drink enough fluid to keep your pee (urine) pale yellow.  Eat soups and other clear broths. General instructions   Take over-the-counter and prescription medicines only as told by your doctor. These include cold medicines, fever reducers, and cough suppressants.  Do not use any products that contain nicotine or tobacco. These include cigarettes and e-cigarettes. If you need help quitting, ask your doctor.  Avoid being where people are smoking (avoid secondhand smoke).  Make sure you get regular shots and get the flu shot every year.  Keep all follow-up visits as told by your doctor. This is important. How to avoid spreading infection to others   Wash your hands often with soap and water. If you do not have soap and water, use hand sanitizer.  Avoid touching your mouth, face, eyes, or nose.  Cough or sneeze into a tissue or your sleeve or elbow. Do not cough or sneeze into your hand or into the air. Contact a doctor if:  You are getting worse, not better.  You have any of these: ? A fever. ? Chills. ? Brown or red mucus in your nose. ? Yellow or brown fluid (discharge)coming from your nose. ? Pain in your face, especially when you bend forward. ? Swollen neck glands. ? Pain with swallowing. ? White areas in the back of your throat. Get help right away if:  You have shortness of breath that gets worse.  You have very bad or constant: ? Headache. ? Ear pain. ? Pain in  your forehead, behind your eyes, and over your cheekbones (sinus pain). ? Chest pain.  You have long-lasting (chronic) lung disease along with any of these: ? Wheezing. ? Long-lasting cough. ? Coughing up blood. ? A change in your usual mucus.  You have a stiff neck.  You have changes in your: ? Vision. ? Hearing. ? Thinking. ? Mood. Summary  An upper respiratory infection (URI) is caused by a germ called a virus. The most common type of URI is often called "the common cold."  URIs usually get better within 7-10 days.  Take over-the-counter and prescription medicines only as told by your doctor. This information is not intended to replace advice given to you by your health care provider. Make sure you discuss any questions you have with your health care provider. Document Released: 08/03/2007 Document Revised: 10/07/2016 Document Reviewed: 10/07/2016 Elsevier Interactive Patient Education  [employer].    If you have lab work done today you will be contacted with your lab results within the next 2 weeks.  If you have not heard from [place] then please contact us. The fastest way to get your results is to register for My Chart.   IF you received an x-ray today, you will receive an invoice from [facility]. Please contact [facility] at [phone] with questions or concerns regarding your invoice.   IF you received labwork today, you will receive an invoice from [place]. Please contact [facility] at [phone] with questions or concerns regarding your invoice.   Our billing staff will not be able to assist you with questions regarding bills from these companies.  You will be contacted with the lab results as soon as they are available. The fastest way to get your results is to activate your My Chart account.  Instructions are located on the last page of this paperwork. If you have not heard from [place] regarding the results in 2 weeks, please contact this office.        Signed,   Meredith Staggers, MD [facility] at [facility].  05/20/18 1:18 PM

## 2018-05-17 NOTE — Patient Instructions (Addendum)
Saline nasal spray if needed for congestion, mucinex for cough. Rest and avoid others while sick. Do not return to work until well. Return to the clinic or go to the nearest emergency room if any of your symptoms worsen or new symptoms occur.  Upper Respiratory Infection, Adult An upper respiratory infection (URI) affects the nose, throat, and upper air passages. URIs are caused by germs (viruses). The most common type of URI is often called "the common cold." Medicines cannot cure URIs, but you can do things at home to relieve your symptoms. URIs usually get better within 7-10 days. Follow these instructions at home: Activity  Rest as needed.  If you have a fever, stay home from work or school until your fever is gone, or until your doctor says you may return to work or school. ? You should stay home until you cannot spread the infection anymore (you are not contagious). ? Your doctor may have you wear a face mask so you have less risk of spreading the infection. Relieving symptoms  Gargle with a salt-water mixture 3-4 times a day or as needed. To make a salt-water mixture, completely dissolve -1 tsp of salt in 1 cup of warm water.  Use a cool-mist humidifier to add moisture to the air. This can help you breathe more easily. Eating and drinking   Drink enough fluid to keep your pee (urine) pale yellow.  Eat soups and other clear broths. General instructions   Take over-the-counter and prescription medicines only as told by your doctor. These include cold medicines, fever reducers, and cough suppressants.  Do not use any products that contain nicotine or tobacco. These include cigarettes and e-cigarettes. If you need help quitting, ask your doctor.  Avoid being where people are smoking (avoid secondhand smoke).  Make sure you get regular shots and get the flu shot every year.  Keep all follow-up visits as told by your doctor. This is important. How to avoid spreading infection to  others   Wash your hands often with soap and water. If you do not have soap and water, use hand sanitizer.  Avoid touching your mouth, face, eyes, or nose.  Cough or sneeze into a tissue or your sleeve or elbow. Do not cough or sneeze into your hand or into the air. Contact a doctor if:  You are getting worse, not better.  You have any of these: ? A fever. ? Chills. ? Brown or red mucus in your nose. ? Yellow or brown fluid (discharge)coming from your nose. ? Pain in your face, especially when you bend forward. ? Swollen neck glands. ? Pain with swallowing. ? White areas in the back of your throat. Get help right away if:  You have shortness of breath that gets worse.  You have very bad or constant: ? Headache. ? Ear pain. ? Pain in your forehead, behind your eyes, and over your cheekbones (sinus pain). ? Chest pain.  You have long-lasting (chronic) lung disease along with any of these: ? Wheezing. ? Long-lasting cough. ? Coughing up blood. ? A change in your usual mucus.  You have a stiff neck.  You have changes in your: ? Vision. ? Hearing. ? Thinking. ? Mood. Summary  An upper respiratory infection (URI) is caused by a germ called a virus. The most common type of URI is often called "the common cold."  URIs usually get better within 7-10 days.  Take over-the-counter and prescription medicines only as told by your doctor. This  information is not intended to replace advice given to you by your health care provider. Make sure you discuss any questions you have with your health care provider. Document Released: 08/03/2007 Document Revised: 10/07/2016 Document Reviewed: 10/07/2016 Elsevier Interactive Patient Education  [employer].    If you have lab work done today you will be contacted with your lab results within the next 2 weeks.  If you have not heard from [place] then please contact us. The fastest way to get your results is to register for My  Chart.   IF you received an x-ray today, you will receive an invoice from [facility]. Please contact [facility] at [phone] with questions or concerns regarding your invoice.   IF you received labwork today, you will receive an invoice from [place]. Please contact [facility] at [phone] with questions or concerns regarding your invoice.   Our billing staff will not be able to assist you with questions regarding bills from these companies.  You will be contacted with the lab results as soon as they are available. The fastest way to get your results is to activate your My Chart account. Instructions are located on the last page of this paperwork. If you have not heard from [place] regarding the results in 2 weeks, please contact this office.

## 2018-05-20 ENCOUNTER — Encounter: Payer: Self-pay | Admitting: Family Medicine

## 2018-06-04 NOTE — Telephone Encounter (Signed)
Patient's mother called office today requesting lab results on patient's behalf for his upcoming doctors appointment. Lab results are printed and at the front desk. Patient will come to office in AM.  Lorenso Courier, CMA

## 2018-06-05 DIAGNOSIS — Z79891 Long term (current) use of opiate analgesic: Secondary | ICD-10-CM | POA: Diagnosis not present

## 2018-07-03 DIAGNOSIS — Z79891 Long term (current) use of opiate analgesic: Secondary | ICD-10-CM | POA: Diagnosis not present

## 2018-07-09 IMAGING — US US ABDOMEN LIMITED
1 series · 14 of 25 positions shown · non-contrast
Comparison: None available.

CLINICAL DATA: Initial evaluation for elevated LFTs.

EXAM:
ULTRASOUND ABDOMEN LIMITED RIGHT UPPER QUADRANT

[Series 1: us abdomen limited · 0.22mm/px · 14 of 53 slices shown]
[im 1/53]
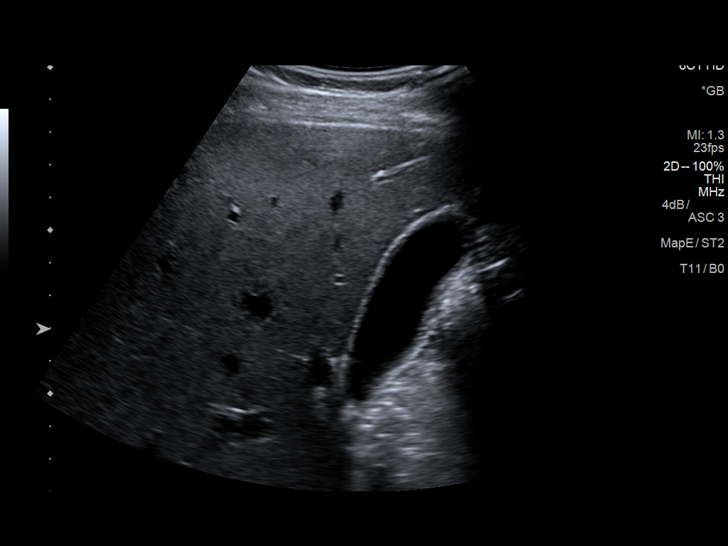
[im 5/53]
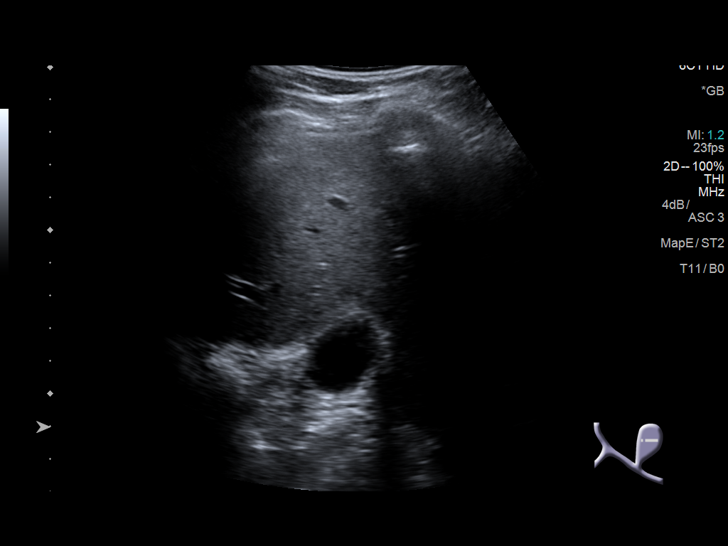
[im 9/53]
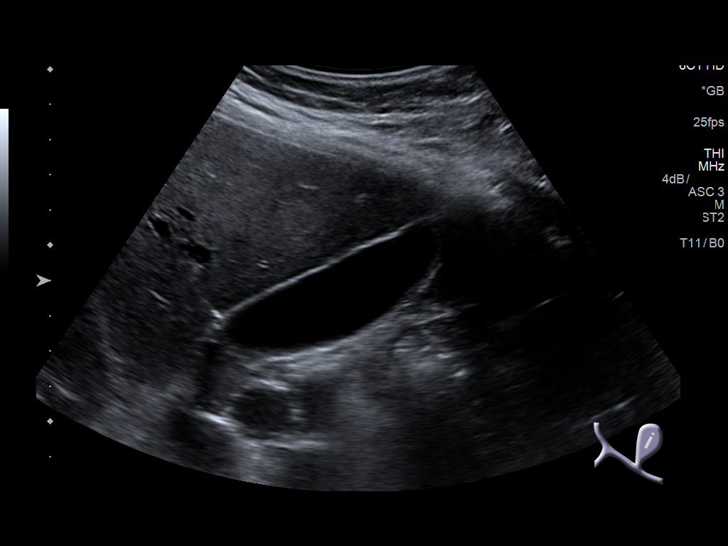
[im 14/53]
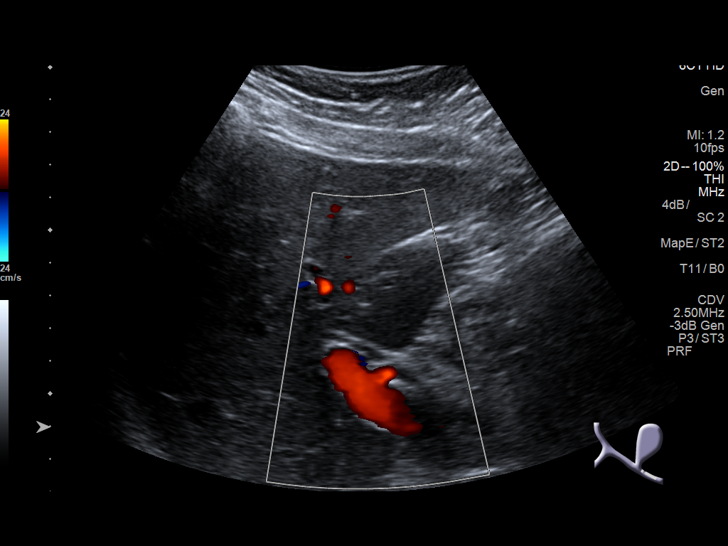
[im 18/53]
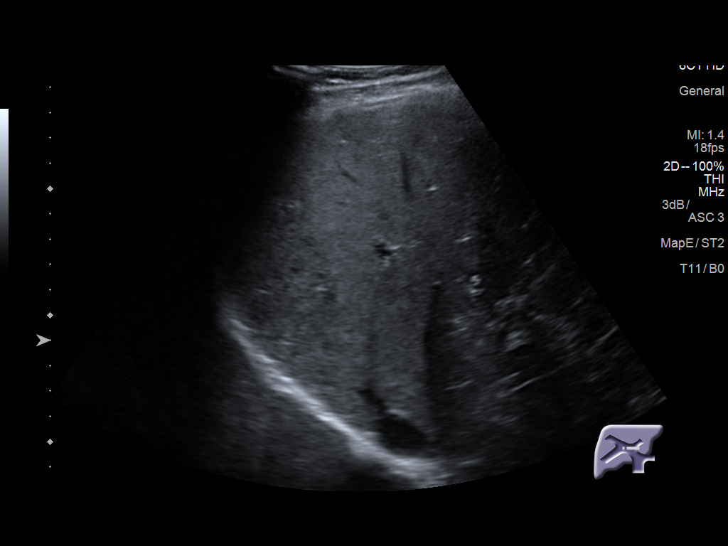
[im 20/53]
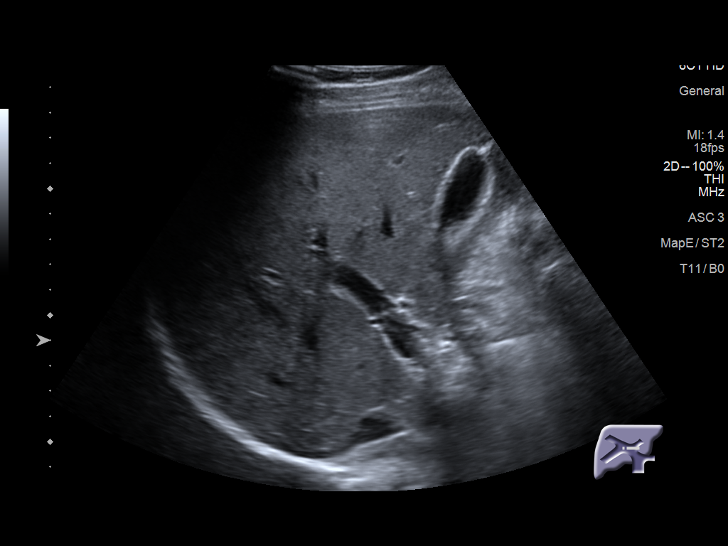
[im 24/53]
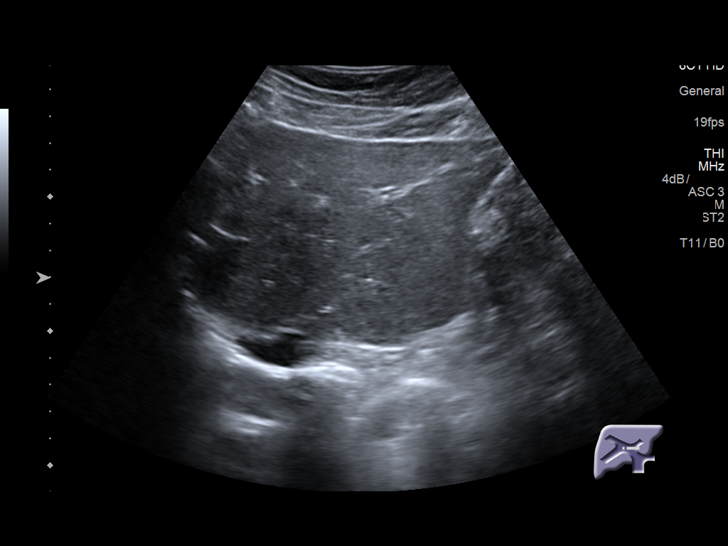
[im 29/53]
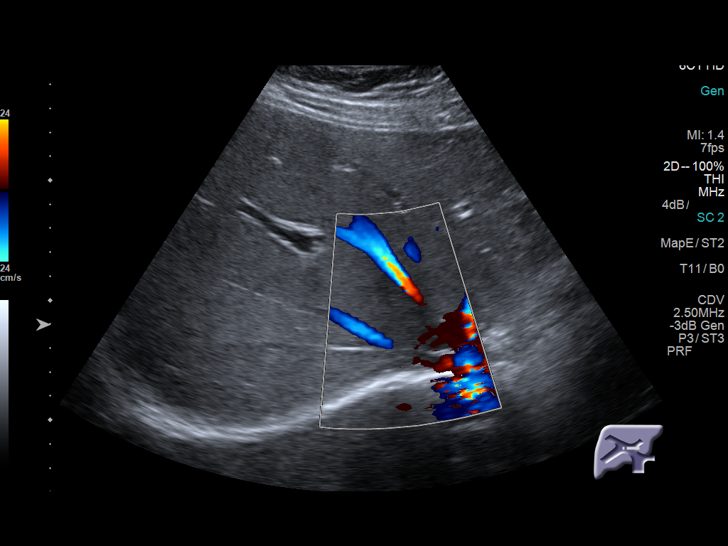
[im 33/53]
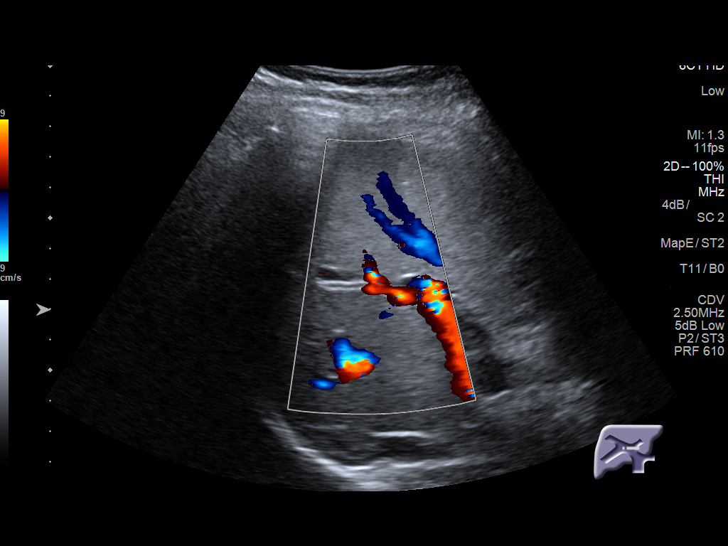
[im 35/53]
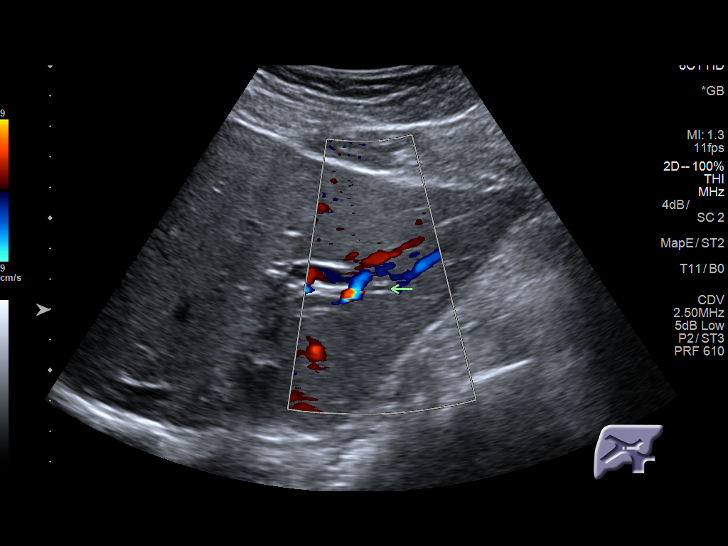
[im 40/53]
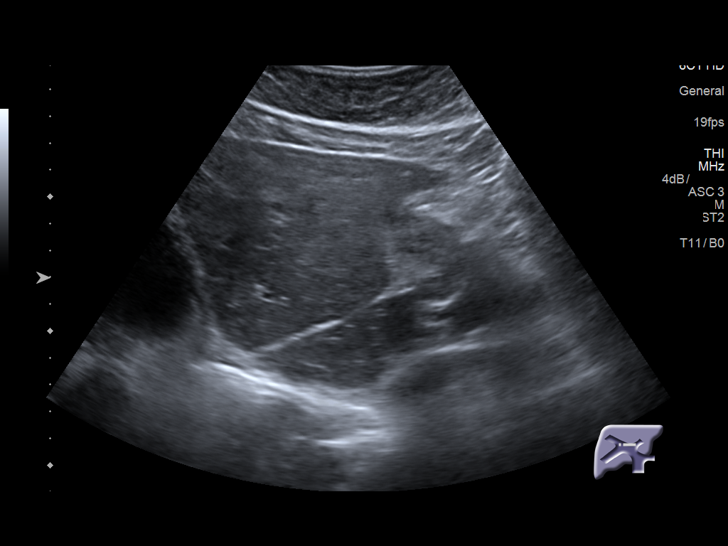
[im 44/53]
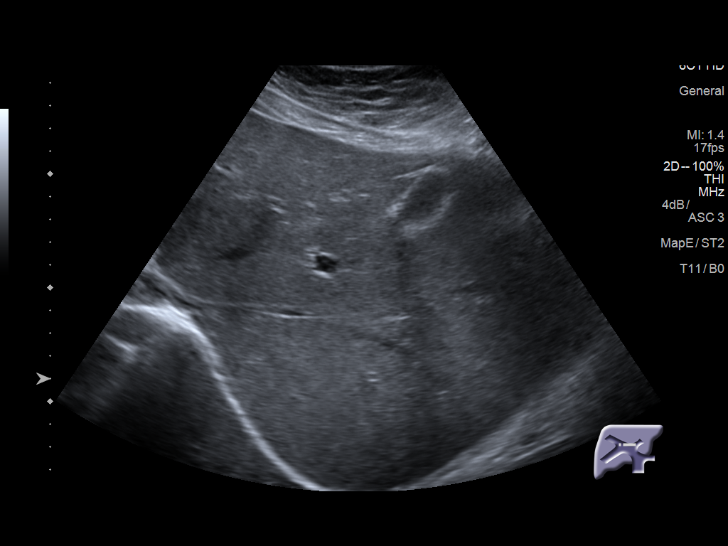
[im 48/53]
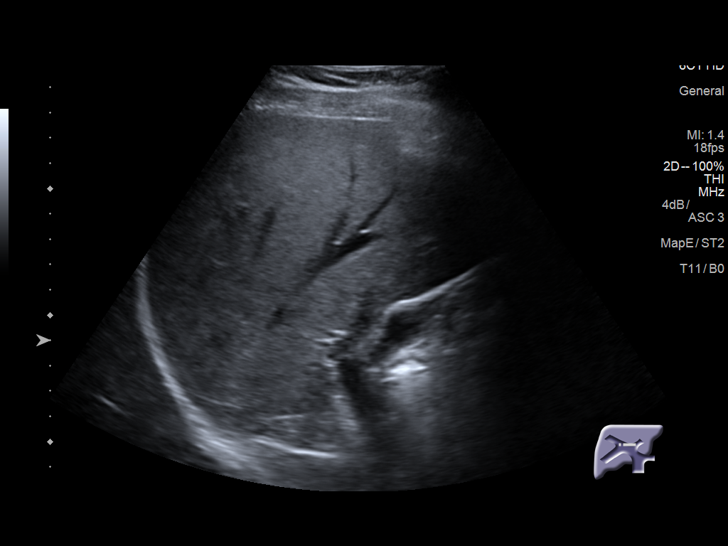
[im 53/53]
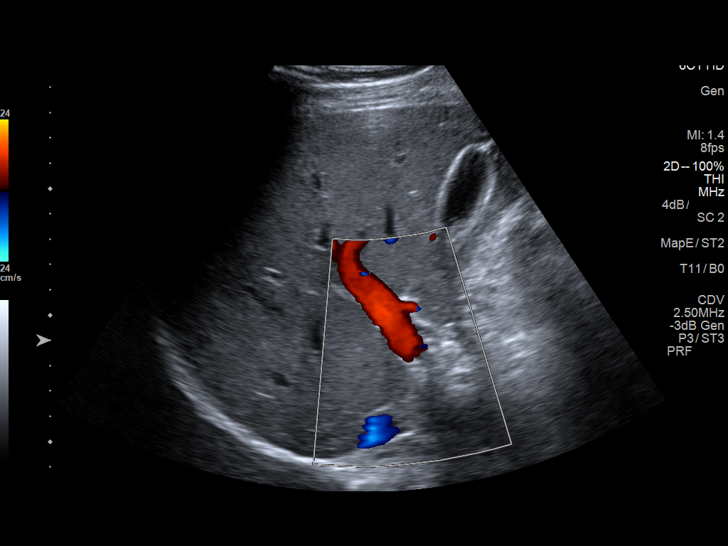

[14 of 25 positions shown; findings below may reference images not displayed]

FINDINGS: Gallbladder:

No gallstones or wall thickening visualized. No sonographic Murphy
sign noted by sonographer.

Common bile duct:

Diameter: 5.9 mm.  Mild intrahepatic biliary dilatation noted.

Liver:

No focal lesion identified. Within normal limits in parenchymal
echogenicity. Portal vein is patent on color Doppler imaging with
normal direction of blood flow towards the liver.
IMPRESSION: 1. Mild intrahepatic biliary dilatation, of uncertain etiology or
significance. No obstructive mass, stone, or other pathology
identified. Common bile duct of normal caliber with no extrahepatic
biliary dilatation identified. Correlation with LFTs recommended.
2. Normal sonographic appearance of the gallbladder.
3. Otherwise normal appearance of the liver.

## 2018-07-31 DIAGNOSIS — Z79891 Long term (current) use of opiate analgesic: Secondary | ICD-10-CM | POA: Diagnosis not present

## 2018-08-28 DIAGNOSIS — Z79891 Long term (current) use of opiate analgesic: Secondary | ICD-10-CM | POA: Diagnosis not present

## 2018-09-18 DIAGNOSIS — Z79891 Long term (current) use of opiate analgesic: Secondary | ICD-10-CM | POA: Diagnosis not present

## 2018-10-02 ENCOUNTER — Other Ambulatory Visit: Payer: Self-pay

## 2018-10-02 ENCOUNTER — Ambulatory Visit (HOSPITAL_COMMUNITY)
Admission: EM | Admit: 2018-10-02 | Discharge: 2018-10-02 | Disposition: A | Discharge status: Home / Self Care | Payer: Worker's Compensation | Attending: Emergency Medicine | Admitting: Emergency Medicine

## 2018-10-02 ENCOUNTER — Encounter (HOSPITAL_COMMUNITY): Payer: Self-pay

## 2018-10-02 DIAGNOSIS — L03114 Cellulitis of left upper limb: Secondary | ICD-10-CM | POA: Diagnosis not present

## 2018-10-02 DIAGNOSIS — T63451A Toxic effect of venom of hornets, accidental (unintentional), initial encounter: Secondary | ICD-10-CM | POA: Diagnosis not present

## 2018-10-02 MED ORDER — CEPHALEXIN 500 MG PO CAPS: 500.0000 mg | ORAL_CAPSULE | Freq: Three times a day (TID) | ORAL | 0 refills | Status: DC

## 2018-10-02 MED ORDER — PREDNISONE 10 MG (21) PO TBPK: ORAL_TABLET | Freq: Every day | ORAL | 0 refills | Status: DC

## 2018-10-02 NOTE — Discharge Instructions (Addendum)
Take the antibiotic Keflex and steroid prednisone as prescribed.    You can take Tylenol or ibuprofen as needed for discomfort    Return here or go to the emergency department if you have increased redness, red streaks, fever, chills, or other signs of worsening infection.    Call 911 to go to the emergency department if you develop difficulty breathing or swallowing.

## 2018-10-02 NOTE — ED Triage Notes (Signed)
Pt presents with redness and swelling to left hand after insect bite yesterday.

## 2018-10-02 NOTE — ED Provider Notes (Signed)
[facility]    CSN: [id number] Arrival date & time: 10/02/18  1511     History   Chief Complaint Chief Complaint  Patient presents with  . Insect Bite    HPI Bob Fowler is a 28 y.o. male.   Patient presents with pain, swelling, and redness in his left hand since being stung by a hornet yesterday.  He states he was at work yesterday and accidentally came in contact with a hornets nest.  Patient reports his hand was initially painful but became red and swollen today.  He denies difficulty breathing or swallowing.    The history is provided by the patient.    Past Medical History:  Diagnosis Date  . Anxiety   . Substance abuse [facility])     Patient Active Problem List   Diagnosis Date Noted  . Vaccine counseling 06/08/2017  . Chronic hepatitis C without hepatic coma ([place]) 05/16/2017  . Sweaty skin 12/07/2016  . Tobacco abuse 05/04/2015  . Anxiety 05/04/2015  . History of narcotic addiction ([place]) 08/27/2014    History reviewed. No pertinent surgical history.     Home Medications    Prior to Admission medications   Medication Sig Start Date End Date Taking? Authorizing Provider  Buprenorphine HCl-Naloxone HCl 8-2 MG FILM DISSOLVE FILM UNDER THE TONGUE TWICE DAILY 06/03/17   [provider]  cephALEXin (KEFLEX) 500 MG capsule Take 1 capsule (500 mg total) by mouth 3 (three) times daily. 10/02/18   Sharion Balloon, NP  predniSONE (STERAPRED UNI-PAK 21 TAB) 10 MG (21) TBPK tablet Take by mouth daily. Take 6 tabs by mouth daily  for 1 day, then 5 tabs for 1 day, then 4 tabs for 1 day, then 3 tabs for 1 day, 2 tabs for 1 day, then 1 tab by mouth daily for 1 day 10/02/18   Sharion Balloon, NP    Family History Family History  Problem Relation Age of Onset  . Diabetes Mother   . Mental illness Maternal Grandmother   . Heart disease Maternal Grandfather     Social History Social History   Tobacco Use  . Smoking status: Current Every Day Smoker    Packs/day:  1.00    Types: Cigarettes  . Smokeless tobacco: Never Used  Substance Use Topics  . Alcohol use: Yes    Alcohol/week: 0.0 standard drinks  . Drug use: Yes    Types: Marijuana     Allergies   Patient has no known allergies.   Review of Systems Review of Systems  Constitutional: Negative for chills and fever.  HENT: Negative for ear pain and sore throat.   Eyes: Negative for pain and visual disturbance.  Respiratory: Negative for cough and shortness of breath.   Cardiovascular: Negative for chest pain and palpitations.  Gastrointestinal: Negative for abdominal pain and vomiting.  Genitourinary: Negative for dysuria and hematuria.  Musculoskeletal: Negative for arthralgias and back pain.  Skin: Positive for wound. Negative for rash.  Neurological: Negative for seizures, syncope, weakness and numbness.  All other systems reviewed and are negative.    Physical Exam Triage Vital Signs ED Triage Vitals  Enc Vitals Group     BP 10/02/18 1538 127/75     Pulse Rate 10/02/18 1538 83     Resp 10/02/18 1538 18     Temp 10/02/18 1538 97.8 F (36.6 C)     Temp Source 10/02/18 1538 Oral     SpO2 10/02/18 1538 100 %  Weight --      Height --      Head Circumference --      Peak Flow --      Pain Score 10/02/18 1539 6     Pain Loc --      Pain Edu? --      Excl. in GC? --    No data found.  Updated Vital Signs BP 127/75 (BP Location: Left Arm)   Pulse 83   Temp 97.8 F (36.6 C) (Oral)   Resp 18   SpO2 100%   Visual Acuity Right Eye Distance:   Left Eye Distance:   Bilateral Distance:    Right Eye Near:   Left Eye Near:    Bilateral Near:     Physical Exam Vitals signs and nursing note reviewed.  Constitutional:      Appearance: He is well-developed.  HENT:     Head: Normocephalic and atraumatic.  Eyes:     Conjunctiva/sclera: Conjunctivae normal.  Neck:     Musculoskeletal: Neck supple.  Cardiovascular:     Rate and Rhythm: Normal rate and regular  rhythm.     Heart sounds: No murmur.  Pulmonary:     Effort: Pulmonary effort is normal. No respiratory distress.     Breath sounds: Normal breath sounds.  Abdominal:     Palpations: Abdomen is soft.     Tenderness: There is no abdominal tenderness.  Musculoskeletal:        General: Swelling and tenderness present.  Skin:    General: Skin is warm and dry.     Findings: Erythema and lesion present.     Comments: Left middle finger: edematous. Left hand: Erythema and edema from MCP to wrist. No streaks.  LUE: 2+ pulses, sensation intact, strength 5/5.   Neurological:     Mental Status: He is alert.     Sensory: No sensory deficit.     Motor: No weakness.      UC Treatments / Results  Labs (all labs ordered are listed, but only abnormal results are displayed) Labs Reviewed - No data to display  EKG   Radiology No results found.  Procedures Procedures (including critical care time)  Medications Ordered in UC Medications - No data to display  Initial Impression / Assessment and Plan / UC Course  I have reviewed the triage vital signs and the nursing notes.  Pertinent labs & imaging results that were available during my care of the patient were reviewed by me and considered in my medical decision making (see chart for details).   Hornet sting.  Cellulitis of left hand.  Treating with prednisone and Keflex.  Instructed patient that he can take Tylenol or ibuprofen as needed.  Instructed patient to return here or go to the ED if he develops increased redness, streaks, fever, chills, other symptoms of infection; or if he develops difficulty swallowing or breathing.     Final Clinical Impressions(s) / UC Diagnoses   Final diagnoses:  Hornet sting, accidental or unintentional, initial encounter  Cellulitis of left upper extremity     Discharge Instructions     Take the antibiotic Keflex and steroid prednisone as prescribed.    You can take Tylenol or ibuprofen as  needed for discomfort    Return here or go to the emergency department if you have increased redness, red streaks, fever, chills, or other signs of worsening infection.    Call 911 to go to the emergency department if you develop difficulty  breathing or swallowing.        ED Prescriptions    Medication Sig Dispense Auth. Provider   cephALEXin (KEFLEX) 500 MG capsule Take 1 capsule (500 mg total) by mouth 3 (three) times daily. 20 capsule Mickie Bailate, Auria Mckinlay H, NP   predniSONE (STERAPRED UNI-PAK 21 TAB) 10 MG (21) TBPK tablet Take by mouth daily. Take 6 tabs by mouth daily  for 1 day, then 5 tabs for 1 day, then 4 tabs for 1 day, then 3 tabs for 1 day, 2 tabs for 1 day, then 1 tab by mouth daily for 1 day 21 tablet Mickie Bailate, Keiri Solano H, NP     Controlled Substance Prescriptions [place] Controlled Substance Registry consulted? Not Applicable   Mickie Bailate, Bryley Chrisman H, NP 10/02/18 [phone]

## 2018-10-23 DIAGNOSIS — Z79891 Long term (current) use of opiate analgesic: Secondary | ICD-10-CM | POA: Diagnosis not present

## 2018-11-27 DIAGNOSIS — Z79891 Long term (current) use of opiate analgesic: Secondary | ICD-10-CM | POA: Diagnosis not present

## 2018-12-28 DIAGNOSIS — F411 Generalized anxiety disorder: Secondary | ICD-10-CM | POA: Diagnosis not present

## 2018-12-28 DIAGNOSIS — F410 Panic disorder [episodic paroxysmal anxiety] without agoraphobia: Secondary | ICD-10-CM | POA: Diagnosis not present

## 2018-12-28 DIAGNOSIS — F39 Unspecified mood [affective] disorder: Secondary | ICD-10-CM | POA: Diagnosis not present

## 2019-01-30 DIAGNOSIS — Z79891 Long term (current) use of opiate analgesic: Secondary | ICD-10-CM | POA: Diagnosis not present

## 2019-02-08 ENCOUNTER — Emergency Department
Admission: EM | Admit: 2019-02-08 | Discharge: 2019-02-08 | Disposition: A | Discharge status: Home / Self Care | Payer: BC Managed Care – PPO | Source: Home / Self Care | Attending: Emergency Medicine | Admitting: Emergency Medicine

## 2019-02-08 ENCOUNTER — Other Ambulatory Visit: Payer: Self-pay

## 2019-02-08 ENCOUNTER — Encounter: Payer: Self-pay | Admitting: *Deleted

## 2019-02-08 ENCOUNTER — Emergency Department (INDEPENDENT_AMBULATORY_CARE_PROVIDER_SITE_OTHER): Payer: BC Managed Care – PPO

## 2019-02-08 DIAGNOSIS — M7989 Other specified soft tissue disorders: Secondary | ICD-10-CM | POA: Diagnosis not present

## 2019-02-08 DIAGNOSIS — M79641 Pain in right hand: Secondary | ICD-10-CM | POA: Diagnosis not present

## 2019-02-08 DIAGNOSIS — S60222A Contusion of left hand, initial encounter: Secondary | ICD-10-CM

## 2019-02-08 DIAGNOSIS — F1910 Other psychoactive substance abuse, uncomplicated: Secondary | ICD-10-CM

## 2019-02-08 DIAGNOSIS — L03114 Cellulitis of left upper limb: Secondary | ICD-10-CM

## 2019-02-08 DIAGNOSIS — Z23 Encounter for immunization: Secondary | ICD-10-CM

## 2019-02-08 MED ORDER — CEPHALEXIN 500 MG PO CAPS: 500.0000 mg | ORAL_CAPSULE | Freq: Three times a day (TID) | ORAL | 0 refills | Status: DC

## 2019-02-08 MED ORDER — TETANUS-DIPHTH-ACELL PERTUSSIS 5-2.5-18.5 LF-MCG/0.5 IM SUSP
0.5000 mL | Freq: Once | INTRAMUSCULAR | Status: AC
  Administered 2019-02-08: 11:00:00 0.5 mL via INTRAMUSCULAR

## 2019-02-08 MED ORDER — CEFTRIAXONE SODIUM 1 G IJ SOLR
1.0000 g | Freq: Once | INTRAMUSCULAR | Status: AC
  Administered 2019-02-08: 1 g via INTRAMUSCULAR

## 2019-02-08 MED ORDER — DOXYCYCLINE HYCLATE 100 MG PO CAPS: 100.0000 mg | ORAL_CAPSULE | Freq: Two times a day (BID) | ORAL | 0 refills | Status: DC

## 2019-02-08 NOTE — ED Triage Notes (Signed)
Pt c/o RT hand pain x 2 days. Slammed it in his car door.

## 2019-02-08 NOTE — ED Provider Notes (Signed)
Bob Fowler CARE    CSN: [id number] Arrival date & time: 02/08/19  1660      History   Chief Complaint Chief Complaint  Patient presents with  . Hand Pain    HPI Bob Fowler is a 28 y.o. male.   HPI Patient was in his usual state of health until 2 days ago when he struck his left hand in a car door.  He developed pain and swelling between his thumb and index finger of the left hand.  He has swelling with redness but no fever.  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  He has been to [facility] for 30-day treatment in the past. Past Medical History:  Diagnosis Date  . Anxiety   . Substance abuse [facility])     Patient Active Problem List   Diagnosis Date Noted  . Vaccine counseling 06/08/2017  . Chronic hepatitis C without hepatic coma ([place]) 05/16/2017  . Sweaty skin 12/07/2016  . Tobacco abuse 05/04/2015  . Anxiety 05/04/2015  . History of narcotic addiction ([place]) 08/27/2014    History reviewed. No pertinent surgical history.     Home Medications    Prior to Admission medications   Medication Sig Start Date End Date Taking? Authorizing Provider  cephALEXin (KEFLEX) 500 MG capsule Take 1 capsule (500 mg total) by mouth 3 (three) times daily. 02/08/19   Darlyne Russian, MD  doxycycline (VIBRAMYCIN) 100 MG capsule Take 1 capsule (100 mg total) by mouth 2 (two) times daily. 02/08/19   Darlyne Russian, MD    Family History Family History  Problem Relation Age of Onset  . Diabetes Mother   . Mental illness Maternal Grandmother   . Heart disease Maternal Grandfather     Social History Social History   Tobacco Use  . Smoking status: Current Every Day Smoker    Packs/day: 1.00    Types: Cigarettes  . Smokeless tobacco: Never Used  Substance Use Topics  . Alcohol use: Yes    Alcohol/week: 0.0 standard drinks  . Drug use: Yes    Types: Marijuana     Allergies   Patient has no known allergies.   Review of  Systems Review of Systems  Constitutional: Negative.  Negative for fever.  Musculoskeletal:       Patient has pain and discomfort at the base of the left thumb involving the webspace and dorsum of the hand over the second metacarpal.       Physical Exam Triage Vital Signs ED Triage Vitals  Enc Vitals Group     BP 02/08/19 1004 (!) 143/88     Pulse Rate 02/08/19 1004 81     Resp 02/08/19 1004 18     Temp 02/08/19 1004 (!) 97.4 F (36.3 C)     Temp Source 02/08/19 1004 Oral     SpO2 02/08/19 1004 98 %     Weight 02/08/19 1005 257 lb (116.6 kg)     Height 02/08/19 1005 5\' 11"  (1.803 m)     Head Circumference --      Peak Flow --      Pain Score 02/08/19 1005 5     Pain Loc --      Pain Edu? --      Excl. in [place]? --    No data found.  Updated Vital Signs BP (!) 143/88 (BP Location: Right Arm)   Pulse 81   Temp (!) 97.4 F (36.3 C) (Oral)  Resp 18   Ht 5\' 11"  (1.803 m)   Wt 116.6 kg   SpO2 98%   BMI 35.84 kg/m   Visual Acuity Right Eye Distance:   Left Eye Distance:   Bilateral Distance:    Right Eye Near:   Left Eye Near:    Bilateral Near:     Physical Exam Constitutional:      Appearance: Normal appearance.  Musculoskeletal:     Comments: There are multiple track marks present over the antecubital area forearms and dorsum of both hands.  There is swelling and redness in the webspace of the left hand with pain to palpation and increased warmth in the area.  There is good range of motion of the index and thumb.  Skin:    Comments: There are multiple track marks present over the antecubital fossa his forearms and hands on the right and the left.  Neurological:     Mental Status: He is alert.      UC Treatments / Results  Labs (all labs ordered are listed, but only abnormal results are displayed) Labs Reviewed - No data to display  EKG   Radiology DG Hand Complete Right  Result Date: 02/08/2019 CLINICAL DATA:  Right hand pain since a crush injury  in a car door 2 days ago. Initial encounter. EXAM: RIGHT HAND - COMPLETE 3+ VIEW COMPARISON:  None. FINDINGS: There is no evidence of fracture or dislocation. There is no evidence of arthropathy or other focal bone abnormality. Soft tissues are unremarkable. Scratch the soft tissues appear swollen. IMPRESSION: Soft tissue swelling without underlying bony or joint abnormality. Electronically Signed   By: 14/12/2018 M.D.   On: 02/08/2019 10:23    Procedures Procedures (including critical care time)  Medications Ordered in UC Medications - No data to display  Initial Impression / Assessment and Plan / UC Course  I have reviewed the triage vital signs and the nursing notes. X-ray does not reveal fracture.  He was given a note to be out of work until Monday.  I am concerned he has a cellulitis of the hand related to injecting with heroin and fentanyl.  He will be on doxycycline 100 mg twice a day.  He was advised to follow-up in the hospital if he has progressive redness involving the hand down to the wrist and forearm.  He was advised to go to [facility] where he has been before to get help with his substance abuse. Pertinent labs & imaging results that were available during my care of the patient were reviewed by me and considered in my medical decision making (see chart for details).      Final Clinical Impressions(s) / UC Diagnoses   Final diagnoses:  Contusion of left hand, initial encounter  Cellulitis of left hand     Discharge Instructions     Please go to [facility] for evaluation. Take antibiotics as instructed. If you develop redness extending down the wrist and into the forearm please go to the emergency room.    ED Prescriptions    Medication Sig Dispense Auth. Provider   doxycycline (VIBRAMYCIN) 100 MG capsule Take 1 capsule (100 mg total) by mouth 2 (two) times daily. 20 capsule [facility] MD   cephALEXin (KEFLEX) 500 MG capsule Take 1 capsule (500  mg total) by mouth 3 (three) times daily. 30 capsule Collene Gobble, MD     PDMP not reviewed this encounter.   Collene Gobble, MD 02/08/19 1455

## 2019-02-08 NOTE — Discharge Instructions (Addendum)
Please go to [facility] for evaluation. Take antibiotics as instructed. If you develop redness extending down the wrist and into the forearm please go to the emergency room. Please try and keep your hand elevated.

## 2019-03-22 DIAGNOSIS — M9903 Segmental and somatic dysfunction of lumbar region: Secondary | ICD-10-CM | POA: Diagnosis not present

## 2019-03-22 DIAGNOSIS — M62830 Muscle spasm of back: Secondary | ICD-10-CM | POA: Diagnosis not present

## 2019-03-22 DIAGNOSIS — M5386 Other specified dorsopathies, lumbar region: Secondary | ICD-10-CM | POA: Diagnosis not present

## 2019-03-22 DIAGNOSIS — M9904 Segmental and somatic dysfunction of sacral region: Secondary | ICD-10-CM | POA: Diagnosis not present

## 2019-03-25 DIAGNOSIS — M62830 Muscle spasm of back: Secondary | ICD-10-CM | POA: Diagnosis not present

## 2019-03-25 DIAGNOSIS — M5386 Other specified dorsopathies, lumbar region: Secondary | ICD-10-CM | POA: Diagnosis not present

## 2019-03-25 DIAGNOSIS — M9903 Segmental and somatic dysfunction of lumbar region: Secondary | ICD-10-CM | POA: Diagnosis not present

## 2019-03-25 DIAGNOSIS — M9904 Segmental and somatic dysfunction of sacral region: Secondary | ICD-10-CM | POA: Diagnosis not present

## 2019-04-03 DIAGNOSIS — M5386 Other specified dorsopathies, lumbar region: Secondary | ICD-10-CM | POA: Diagnosis not present

## 2019-04-03 DIAGNOSIS — M9904 Segmental and somatic dysfunction of sacral region: Secondary | ICD-10-CM | POA: Diagnosis not present

## 2019-04-03 DIAGNOSIS — M62830 Muscle spasm of back: Secondary | ICD-10-CM | POA: Diagnosis not present

## 2019-04-03 DIAGNOSIS — M9903 Segmental and somatic dysfunction of lumbar region: Secondary | ICD-10-CM | POA: Diagnosis not present

## 2019-04-16 DIAGNOSIS — Z79891 Long term (current) use of opiate analgesic: Secondary | ICD-10-CM | POA: Diagnosis not present

## 2019-04-27 DIAGNOSIS — Z20828 Contact with and (suspected) exposure to other viral communicable diseases: Secondary | ICD-10-CM | POA: Diagnosis not present

## 2019-04-27 DIAGNOSIS — Z03818 Encounter for observation for suspected exposure to other biological agents ruled out: Secondary | ICD-10-CM | POA: Diagnosis not present

## 2021-01-04 ENCOUNTER — Observation Stay (HOSPITAL_COMMUNITY)
Admission: EM | Admit: 2021-01-04 | Discharge: 2021-01-05 | Disposition: A | Discharge status: Home / Self Care | Payer: Managed Care, Other (non HMO) | Attending: General Surgery | Admitting: General Surgery

## 2021-01-04 ENCOUNTER — Emergency Department (HOSPITAL_COMMUNITY): Payer: Managed Care, Other (non HMO)

## 2021-01-04 ENCOUNTER — Other Ambulatory Visit: Payer: Self-pay

## 2021-01-04 DIAGNOSIS — Z20822 Contact with and (suspected) exposure to covid-19: Secondary | ICD-10-CM | POA: Insufficient documentation

## 2021-01-04 DIAGNOSIS — R112 Nausea with vomiting, unspecified: Secondary | ICD-10-CM | POA: Diagnosis present

## 2021-01-04 DIAGNOSIS — K37 Unspecified appendicitis: Secondary | ICD-10-CM | POA: Diagnosis present

## 2021-01-04 DIAGNOSIS — K353 Acute appendicitis with localized peritonitis, without perforation or gangrene: Principal | ICD-10-CM | POA: Insufficient documentation

## 2021-01-04 DIAGNOSIS — F17210 Nicotine dependence, cigarettes, uncomplicated: Secondary | ICD-10-CM | POA: Diagnosis not present

## 2021-01-04 LAB — COMPREHENSIVE METABOLIC PANEL
ALT: 26 U/L (ref 0–44)
AST: 23 U/L (ref 15–41)
Albumin: 4 g/dL (ref 3.5–5.0)
Alkaline Phosphatase: 104 U/L (ref 38–126)
Anion gap: 10 (ref 5–15)
BUN: 7 mg/dL (ref 6–20)
CO2: 25 mmol/L (ref 22–32)
Calcium: 9.4 mg/dL (ref 8.9–10.3)
Chloride: 105 mmol/L (ref 98–111)
Creatinine, Ser: 0.84 mg/dL (ref 0.61–1.24)
GFR, Estimated: >60 mL/min (ref >60)
Glucose, Bld: 80 mg/dL (ref 70–99)
Potassium: 4.4 mmol/L (ref 3.5–5.1)
Sodium: 140 mmol/L (ref 135–145)
Total Bilirubin: 0.8 mg/dL (ref 0.3–1.2)
Total Protein: 7.7 g/dL (ref 6.5–8.1)

## 2021-01-04 LAB — CBC
HCT: 45.5 % (ref 39.0–52.0)
Hemoglobin: 14.8 g/dL (ref 13.0–17.0)
MCH: 26.3 pg (ref 26.0–34.0)
MCHC: 32.5 g/dL (ref 30.0–36.0)
MCV: 81 fL (ref 80.0–100.0)
Platelets: 243 10*3/uL (ref 150–400)
RBC: 5.62 MIL/uL (ref 4.22–5.81)
RDW: 13.4 % (ref 11.5–15.5)
WBC: 12.4 10*3/uL — ABNORMAL HIGH (ref 4.0–10.5)
nRBC: 0 % (ref 0.0–0.2)

## 2021-01-04 LAB — LIPASE, BLOOD: Lipase: 27 U/L (ref 11–51)

## 2021-01-04 MED ORDER — SIMETHICONE 80 MG PO CHEW: 40.0000 mg | CHEWABLE_TABLET | Freq: Four times a day (QID) | ORAL | Status: DC | PRN

## 2021-01-04 MED ORDER — ONDANSETRON 4 MG PO TBDP: 4.0000 mg | ORAL_TABLET | Freq: Four times a day (QID) | ORAL | Status: DC | PRN

## 2021-01-04 MED ORDER — LACTATED RINGERS IV BOLUS
1000.0000 mL | Freq: Once | INTRAVENOUS | Status: AC
  Administered 2021-01-04: 1000 mL via INTRAVENOUS

## 2021-01-04 MED ORDER — ENOXAPARIN SODIUM 40 MG/0.4ML IJ SOSY: 40.0000 mg | PREFILLED_SYRINGE | INTRAMUSCULAR | Status: DC

## 2021-01-04 MED ORDER — NAPROXEN 250 MG PO TABS
500.0000 mg | ORAL_TABLET | Freq: Two times a day (BID) | ORAL | Status: DC | PRN
  Administered 2021-01-05: 500 mg via ORAL
  Filled 2021-01-04: qty 2

## 2021-01-04 MED ORDER — MORPHINE SULFATE (PF) 4 MG/ML IV SOLN
4.0000 mg | Freq: Once | INTRAVENOUS | Status: AC
  Administered 2021-01-04: 4 mg via INTRAVENOUS
  Filled 2021-01-04: qty 1

## 2021-01-04 MED ORDER — METHOCARBAMOL 500 MG PO TABS: 500.0000 mg | ORAL_TABLET | Freq: Four times a day (QID) | ORAL | Status: DC | PRN

## 2021-01-04 MED ORDER — ONDANSETRON HCL 4 MG/2ML IJ SOLN
4.0000 mg | Freq: Once | INTRAMUSCULAR | Status: AC
  Administered 2021-01-04: 4 mg via INTRAVENOUS
  Filled 2021-01-04: qty 2

## 2021-01-04 MED ORDER — IOHEXOL 300 MG/ML  SOLN
100.0000 mL | Freq: Once | INTRAMUSCULAR | Status: AC | PRN
  Administered 2021-01-04: 100 mL via INTRAVENOUS

## 2021-01-04 MED ORDER — PIPERACILLIN-TAZOBACTAM 3.375 G IVPB
3.3750 g | Freq: Three times a day (TID) | INTRAVENOUS | Status: DC
  Administered 2021-01-05: 3.375 g via INTRAVENOUS
  Filled 2021-01-04: qty 50

## 2021-01-04 MED ORDER — ONDANSETRON HCL 4 MG/2ML IJ SOLN: 4.0000 mg | Freq: Four times a day (QID) | INTRAMUSCULAR | Status: DC | PRN

## 2021-01-04 MED ORDER — ACETAMINOPHEN 325 MG PO TABS: 650.0000 mg | ORAL_TABLET | Freq: Four times a day (QID) | ORAL | Status: DC | PRN

## 2021-01-04 MED ORDER — ACETAMINOPHEN 650 MG RE SUPP: 650.0000 mg | Freq: Four times a day (QID) | RECTAL | Status: DC | PRN

## 2021-01-04 MED ORDER — PIPERACILLIN-TAZOBACTAM 3.375 G IVPB
3.3750 g | Freq: Three times a day (TID) | INTRAVENOUS | Status: DC
  Administered 2021-01-04: 3.375 g via INTRAVENOUS
  Filled 2021-01-04: qty 50

## 2021-01-04 NOTE — H&P (Signed)
Bob Fowler is an 30 y.o. male.   Chief Complaint: ab pain HPI: 64 yom with pmh of ivda, hep c who presented to er with rlq ab pain starting Saturday.  This has progressively worsened.  Nothing he was doing at home was helping.  He has had some nausea and had emesis with the onset but none since.  He has chronic constipation and has not had a bowel movement recently but this is not anything new for him.  He has no fever.  He thought initially this was due to some food that he ate.  Denies any urinary symptoms.  According to the ER his last IV drug use was 2 days ago with heroin and he is on daily methadone.  He has a elevated white blood cell count and had a CT scan that shows acute appendicitis without evidence of perforation and I was asked to see him.  Past Medical History:  Diagnosis Date   Anxiety    Substance abuse ([facility])     No past surgical history on file.  Family History  Problem Relation Age of Onset   Diabetes Mother    Mental illness Maternal Grandmother    Heart disease Maternal Grandfather    Social History:  reports that he has been smoking cigarettes. He has been smoking an average of 1 pack per day. He has never used smokeless tobacco. He reports current alcohol use. He reports current drug use. Drug: Marijuana.  Allergies: No Known Allergies No current facility-administered medications on file prior to encounter.   Current Outpatient Medications on File Prior to Encounter  Medication Sig Dispense Refill   cephALEXin (KEFLEX) 500 MG capsule Take 1 capsule (500 mg total) by mouth 3 (three) times daily. 30 capsule 0   doxycycline (VIBRAMYCIN) 100 MG capsule Take 1 capsule (100 mg total) by mouth 2 (two) times daily. 20 capsule 0      Results for orders placed or performed during the hospital encounter of 01/04/21 (from the past 48 hour(s))  Lipase, blood     Status: None   Collection Time: 01/04/21  1:14 PM  Result Value Ref Range   Lipase 27 11 - 51 U/L     Comment: Performed at [facility], [address], [place], [place] [address]  Comprehensive metabolic panel     Status: None   Collection Time: 01/04/21  1:14 PM  Result Value Ref Range   Sodium 140 135 - 145 mmol/L   Potassium 4.4 3.5 - 5.1 mmol/L   Chloride 105 98 - 111 mmol/L   CO2 25 22 - 32 mmol/L   Glucose, Bld 80 70 - 99 mg/dL    Comment: Glucose reference range applies only to samples taken after fasting for at least 8 hours.   BUN 7 6 - 20 mg/dL   Creatinine, Ser 8.76 0.61 - 1.24 mg/dL   Calcium 9.4 8.9 - 81.1 mg/dL   Total Protein 7.7 6.5 - 8.1 g/dL   Albumin 4.0 3.5 - 5.0 g/dL   AST 23 15 - 41 U/L   ALT 26 0 - 44 U/L   Alkaline Phosphatase 104 38 - 126 U/L   Total Bilirubin 0.8 0.3 - 1.2 mg/dL   GFR, Estimated >57 >26 mL/min    Comment: (NOTE) Calculated using the CKD-EPI Creatinine Equation (2021)    Anion gap 10 5 - 15    Comment: Performed at [facility], [address] [place], [place] [address]  CBC  Status: Abnormal   Collection Time: 01/04/21  1:14 PM  Result Value Ref Range   WBC 12.4 (H) 4.0 - 10.5 K/uL   RBC 5.62 4.22 - 5.81 MIL/uL   Hemoglobin 14.8 13.0 - 17.0 g/dL   HCT 38.7 56.4 - 33.2 %   MCV 81.0 80.0 - 100.0 fL   MCH 26.3 26.0 - 34.0 pg   MCHC 32.5 30.0 - 36.0 g/dL   RDW 95.1 88.4 - 16.6 %   Platelets 243 150 - 400 K/uL   nRBC 0.0 0.0 - 0.2 %    Comment: Performed at [facility], [address], [place], [place] [address]   CT ABDOMEN PELVIS W CONTRAST  Result Date: 01/04/2021 CLINICAL DATA:  Right lower quadrant pain, nausea/vomiting EXAM: CT ABDOMEN AND PELVIS WITH CONTRAST TECHNIQUE: Multidetector CT imaging of the abdomen and pelvis was performed using the standard protocol following bolus administration of intravenous contrast. CONTRAST:  OMNIPAQUE IOHEXOL 300 MG/ML  SOLN COMPARISON:  None. FINDINGS: Lower chest: Minimal subpleural reticulation at the lung bases. Hepatobiliary: Liver is within normal limits,  noting focal fat altered perfusion along the falciform ligament. Gallbladder is unremarkable. No intrahepatic or extrahepatic ductal dilatation. Pancreas: Within normal limits. Spleen: Within normal limits. Adrenals/Urinary Tract: Adrenal glands are within normal limits. Kidneys are within normal limits.  No hydronephrosis. Bladder is within normal limits. Stomach/Bowel: Stomach is within normal limits. No evidence of bowel obstruction. Dilated proximal appendix, measuring 16 mm, with periappendiceal inflammatory changes, reflecting acute appendicitis (series 3/image 62). Associated small distal appendicolith (series 3/image 83). No drainable fluid collection/abscess. No free air to suggest macroscopic perforation. Vascular/Lymphatic: No evidence of abdominal aortic aneurysm. No suspicious abdominopelvic lymphadenopathy. Reproductive: Prostate is unremarkable. Other: No abdominopelvic ascites. Musculoskeletal: Visualized osseous structures are within normal limits. IMPRESSION: Acute appendicitis with distal appendicolith. No drainable fluid collection/abscess. No free air to suggest macroscopic perforation. Electronically Signed   By: Charline Bills M.D.   On: 01/04/2021 21:56    Review of Systems  Constitutional:  Negative for fever.  Gastrointestinal:  Positive for abdominal pain, constipation, nausea and vomiting.  All other systems reviewed and are negative.  Blood pressure 132/75, pulse 62, temperature 98.5 F (36.9 C), resp. rate 17, SpO2 99 %. Physical Exam Constitutional:      Appearance: He is well-developed.  Eyes:     General: No scleral icterus. Cardiovascular:     Rate and Rhythm: Normal rate.  Pulmonary:     Effort: Pulmonary effort is normal.  Abdominal:     General: Bowel sounds are normal. There is no distension.     Palpations: Abdomen is soft.     Tenderness: There is abdominal tenderness in the right lower quadrant and suprapubic area.     Hernia: No hernia is present.   Skin:    General: Skin is warm and dry.     Capillary Refill: Capillary refill takes less than 2 seconds.  Neurological:     General: No focal deficit present.     Mental Status: He is alert.  Psychiatric:        Mood and Affect: Mood normal.        Behavior: Behavior normal.     Assessment/Plan Appendicitis  We discussed the pathophysiology of appendicitis.  I discussed admission tonight and plan for laparoscopic appendectomy in the morning with my partner Dr. Janee Morn.  We discussed the procedure as well as the recovery.  Emelia Loron, MD 01/04/2021, 11:21 PM

## 2021-01-04 NOTE — ED Triage Notes (Signed)
Pt with RLQ pain onset Saturday night with multiple episodes of vomiting that night. Denies diarrhea. N/v has subsided but pain continues.

## 2021-01-04 NOTE — ED Provider Notes (Signed)
[facility] EMERGENCY DEPARTMENT Provider Note   CSN: [id number] Arrival date & time: 01/04/21  1227     History Chief Complaint  Patient presents with   Abdominal Pain    Bob Fowler is a 30 y.o. male.   Abdominal Pain Associated symptoms: constipation, nausea and vomiting   Associated symptoms: no chest pain, no chills, no cough, no diarrhea, no dysuria, no fever, no hematuria, no shortness of breath and no sore throat    30 year old male with PMH significant for chronic hepatitis C, tobacco use, methadone and current IV drug use as below who presents to the ED with complaints of abdominal pain.  Starting Saturday night, he had gradual onset of RLQ and periumbilical pain associated with nausea and many episodes of NBNB emesis.  He assumed it was secondary to [place] he had had that night, but the pain has been persistent and worsening.  It is associated with nausea, diaphoresis, and decreased appetite.  He states he has not eaten anything since last night.  Denies any fevers or chills, chest pain or shortness of breath, cough, abdominal distention, hematuria or dysuria, penile pain or discharge, scrotal pain, diarrhea, or any other complaints.  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  Last IV drug use was 2 days ago with heroin.  Took his daily methadone this morning but no other opioid use.  He states he tested negative for HIV approximately 3 years ago but has not been tested since.  Denies any prior abdominal surgeries.  Denies blood thinner use.  Past Medical History:  Diagnosis Date   Anxiety    Substance abuse [facility])     Patient Active Problem List   Diagnosis Date Noted   Appendicitis 01/04/2021   Vaccine counseling 06/08/2017   Chronic hepatitis C without hepatic coma ([place]) 05/16/2017   Sweaty skin 12/07/2016   Tobacco abuse 05/04/2015   Anxiety 05/04/2015   History of narcotic addiction ([place]) 08/27/2014    No past surgical  history on file.     Family History  Problem Relation Age of Onset   Diabetes Mother    Mental illness Maternal Grandmother    Heart disease Maternal Grandfather     Social History   Tobacco Use   Smoking status: Every Day    Packs/day: 1.00    Types: Cigarettes   Smokeless tobacco: Never  Vaping Use   Vaping Use: Never used  Substance Use Topics   Alcohol use: Yes    Alcohol/week: 0.0 standard drinks   Drug use: Yes    Types: Marijuana    Home Medications Prior to Admission medications   Medication Sig Start Date End Date Taking? Authorizing Provider  cephALEXin (KEFLEX) 500 MG capsule Take 1 capsule (500 mg total) by mouth 3 (three) times daily. 02/08/19   Darlyne Russian, MD  doxycycline (VIBRAMYCIN) 100 MG capsule Take 1 capsule (100 mg total) by mouth 2 (two) times daily. 02/08/19   Darlyne Russian, MD    Allergies    Patient has no known allergies.  Review of Systems   Review of Systems  Constitutional:  Positive for activity change, appetite change and diaphoresis. Negative for chills and fever.  HENT:  Negative for congestion and sore throat.   Eyes:  Negative for photophobia and visual disturbance.  Respiratory:  Negative for cough and shortness of breath.   Cardiovascular:  Negative for chest pain, palpitations and leg swelling.  Gastrointestinal:  Positive for abdominal pain,  constipation, nausea and vomiting. Negative for abdominal distention, blood in stool and diarrhea.  Genitourinary:  Positive for decreased urine volume. Negative for difficulty urinating, dysuria, enuresis, flank pain, frequency, genital sores, hematuria, penile discharge, penile pain, penile swelling, scrotal swelling, testicular pain and urgency.  Musculoskeletal:  Negative for back pain, neck pain and neck stiffness.  Skin:  Positive for wound. Negative for color change and pallor.  Neurological:  Negative for dizziness, tremors, syncope, weakness, light-headedness, numbness and  headaches.  Hematological:  Does not bruise/bleed easily.  Psychiatric/Behavioral:  Negative for confusion. The patient is not nervous/anxious.   All other systems reviewed and are negative.  Physical Exam Updated Vital Signs BP 132/75   Pulse 62   Temp 98.5 F (36.9 C)   Resp 17   SpO2 99%   Physical Exam Vitals and nursing note reviewed.  Constitutional:      General: He is not in acute distress.    Appearance: Normal appearance. He is well-developed. He is obese. He is not ill-appearing, toxic-appearing or diaphoretic.  HENT:     Head: Normocephalic and atraumatic.     Right Ear: External ear normal.     Left Ear: External ear normal.     Nose: Nose normal.     Mouth/Throat:     Mouth: Mucous membranes are moist.     Pharynx: Oropharynx is clear. No pharyngeal swelling or oropharyngeal exudate.  Eyes:     Extraocular Movements: Extraocular movements intact.     Conjunctiva/sclera: Conjunctivae normal.     Pupils: Pupils are equal, round, and reactive to light.  Cardiovascular:     Rate and Rhythm: Normal rate and regular rhythm.     Pulses: Normal pulses.     Heart sounds: Normal heart sounds. No murmur heard. Pulmonary:     Effort: Pulmonary effort is normal. No respiratory distress.     Breath sounds: Normal breath sounds.  Abdominal:     General: Abdomen is protuberant.     Palpations: Abdomen is soft. There is no hepatomegaly or splenomegaly.     Tenderness: There is abdominal tenderness in the right lower quadrant and suprapubic area. There is guarding and rebound. There is no right CVA tenderness or left CVA tenderness. Positive signs include Rovsing's sign and McBurney's sign. Negative signs include Murphy's sign.     Hernia: No hernia is present.  Musculoskeletal:        General: Normal range of motion.     Cervical back: Neck supple.     Right lower leg: No edema.     Left lower leg: No edema.  Skin:    General: Skin is warm and dry.     Capillary Refill:  Capillary refill takes less than 2 seconds.     Comments: Multiple sites of bruising in the bilateral ACs and bilateral ankles with old needle puncture sites.  Erythema with 2 cm surrounding cellulitis and right ankle injection site  Neurological:     General: No focal deficit present.     Mental Status: He is alert and oriented to person, place, and time.  Psychiatric:        Mood and Affect: Mood normal.        Behavior: Behavior normal. Behavior is cooperative.    ED Results / Procedures / Treatments   Labs (all labs ordered are listed, but only abnormal results are displayed) Labs Reviewed  CBC - Abnormal; Notable for the following components:      Result Value  WBC 12.4 (*)    All other components within normal limits  LIPASE, BLOOD  COMPREHENSIVE METABOLIC PANEL  URINALYSIS, ROUTINE W REFLEX MICROSCOPIC  HEPATITIS PANEL, ACUTE  HIV ANTIBODY (ROUTINE TESTING W REFLEX)    EKG None  Radiology CT ABDOMEN PELVIS W CONTRAST  Result Date: 01/04/2021 CLINICAL DATA:  Right lower quadrant pain, nausea/vomiting EXAM: CT ABDOMEN AND PELVIS WITH CONTRAST TECHNIQUE: Multidetector CT imaging of the abdomen and pelvis was performed using the standard protocol following bolus administration of intravenous contrast. CONTRAST:  OMNIPAQUE IOHEXOL 300 MG/ML  SOLN COMPARISON:  None. FINDINGS: Lower chest: Minimal subpleural reticulation at the lung bases. Hepatobiliary: Liver is within normal limits, noting focal fat altered perfusion along the falciform ligament. Gallbladder is unremarkable. No intrahepatic or extrahepatic ductal dilatation. Pancreas: Within normal limits. Spleen: Within normal limits. Adrenals/Urinary Tract: Adrenal glands are within normal limits. Kidneys are within normal limits.  No hydronephrosis. Bladder is within normal limits. Stomach/Bowel: Stomach is within normal limits. No evidence of bowel obstruction. Dilated proximal appendix, measuring 16 mm, with  periappendiceal inflammatory changes, reflecting acute appendicitis (series 3/image 62). Associated small distal appendicolith (series 3/image 83). No drainable fluid collection/abscess. No free air to suggest macroscopic perforation. Vascular/Lymphatic: No evidence of abdominal aortic aneurysm. No suspicious abdominopelvic lymphadenopathy. Reproductive: Prostate is unremarkable. Other: No abdominopelvic ascites. Musculoskeletal: Visualized osseous structures are within normal limits. IMPRESSION: Acute appendicitis with distal appendicolith. No drainable fluid collection/abscess. No free air to suggest macroscopic perforation. Electronically Signed   By: Charline Bills M.D.   On: 01/04/2021 21:56    Procedures Procedures   Medications Ordered in ED Medications  enoxaparin (LOVENOX) injection 40 mg (has no administration in time range)  piperacillin-tazobactam (ZOSYN) IVPB 3.375 g (3.375 g Intravenous Not Given 01/04/21 2328)  acetaminophen (TYLENOL) tablet 650 mg (has no administration in time range)    Or  acetaminophen (TYLENOL) suppository 650 mg (has no administration in time range)  naproxen (NAPROSYN) tablet 500 mg (has no administration in time range)  methocarbamol (ROBAXIN) tablet 500 mg (has no administration in time range)  ondansetron (ZOFRAN-ODT) disintegrating tablet 4 mg (has no administration in time range)    Or  ondansetron (ZOFRAN) injection 4 mg (has no administration in time range)  simethicone (MYLICON) chewable tablet 40 mg (has no administration in time range)  ondansetron (ZOFRAN) injection 4 mg (4 mg Intravenous Given 01/04/21 2156)  morphine 4 MG/ML injection 4 mg (4 mg Intravenous Given 01/04/21 2155)  lactated ringers bolus 1,000 mL (1,000 mLs Intravenous New Bag/Given 01/04/21 2155)  iohexol (OMNIPAQUE) 300 MG/ML solution 100 mL (100 mLs Intravenous Contrast Given 01/04/21 2151)  morphine 4 MG/ML injection 4 mg (4 mg Intravenous Given 01/04/21 2328)    ED Course   I have reviewed the triage vital signs and the nursing notes.  Pertinent labs & imaging results that were available during my care of the patient were reviewed by me and considered in my medical decision making (see chart for details).    MDM Rules/Calculators/A&P                           Bob Fowler is a 30 y.o. male presenting with abdominal pain. Initial VS wnl.  Labs: Mild leukocytosis of 12.5, CMP and lipase WNL.  HIV and hepatitis panel ordered and pending.  UA pending.   Imaging: CT remarkable for acute appendicitis and appendicolith. Imaging was reviewed by radiology and personally by me.  DDX considered:  cholecystitis, cholangitis, intraabdominal abscess, constipation, diverticulitis, bowel obstruction, gastroenteritis. History, examination, and objective data most consistent with acute appendicitis. No other surgical abdominal emergency or etiology of symptoms suspected. Pt non-septic appearing, hemodynamically stable. No obstructive process identified. Negative Murphy's, no jaundice or RUQ pain/elevated LFTs. H/o prior treated Hep C, no recent HIV testing with h/o active IVDU.  Medications: Medications  enoxaparin (LOVENOX) injection 40 mg (has no administration in time range)  piperacillin-tazobactam (ZOSYN) IVPB 3.375 g (3.375 g Intravenous Not Given 01/04/21 2328)  acetaminophen (TYLENOL) tablet 650 mg (has no administration in time range)    Or  acetaminophen (TYLENOL) suppository 650 mg (has no administration in time range)  naproxen (NAPROSYN) tablet 500 mg (has no administration in time range)  methocarbamol (ROBAXIN) tablet 500 mg (has no administration in time range)  ondansetron (ZOFRAN-ODT) disintegrating tablet 4 mg (has no administration in time range)    Or  ondansetron (ZOFRAN) injection 4 mg (has no administration in time range)  simethicone (MYLICON) chewable tablet 40 mg (has no administration in time range)  ondansetron (ZOFRAN) injection 4 mg (4 mg  Intravenous Given 01/04/21 2156)  morphine 4 MG/ML injection 4 mg (4 mg Intravenous Given 01/04/21 2155)  lactated ringers bolus 1,000 mL (1,000 mLs Intravenous New Bag/Given 01/04/21 2155)  iohexol (OMNIPAQUE) 300 MG/ML solution 100 mL (100 mLs Intravenous Contrast Given 01/04/21 2151)  morphine 4 MG/ML injection 4 mg (4 mg Intravenous Given 01/04/21 2328)    Re-evaluated prior to admission. Hemodynamically stable and in no acute distress.  Patient states he is feeling restless and wants to leave AMA.  Long discussion held with patient regarding risks and benefits, intended care plan, and recommendations of the ED care team.  Patient agrees to stay for further treatment.  EGS consulted.  Admitted to surgery in stable condition. Further management per accepting team. Patient understands and agrees with the plan. Remains NPO.   The plan for this patient was discussed with my attending physician, who voiced agreement and who oversaw evaluation and treatment of this patient.     Note: Estate manager/land agent was used in the creation of this note.  Final Clinical Impression(s) / ED Diagnoses Final diagnoses:  Acute appendicitis with localized peritonitis, without perforation, abscess, or gangrene    Rx / DC Orders ED Discharge Orders     None        Cherly Hensen, DO 01/04/21 2335    Lajean Saver, MD 01/05/21 1544

## 2021-01-05 ENCOUNTER — Encounter (HOSPITAL_COMMUNITY)
Admission: EM | Disposition: A | Discharge status: Home / Self Care | Payer: Self-pay | Source: Home / Self Care | Attending: Emergency Medicine

## 2021-01-05 ENCOUNTER — Observation Stay (HOSPITAL_COMMUNITY): Payer: Managed Care, Other (non HMO) | Admitting: Anesthesiology

## 2021-01-05 ENCOUNTER — Encounter (HOSPITAL_COMMUNITY): Payer: Self-pay

## 2021-01-05 HISTORY — PX: LAPAROSCOPIC APPENDECTOMY: SHX408

## 2021-01-05 LAB — URINALYSIS, ROUTINE W REFLEX MICROSCOPIC
Bilirubin Urine: NEGATIVE
Glucose, UA: NEGATIVE mg/dL
Hgb urine dipstick: NEGATIVE
Ketones, ur: NEGATIVE mg/dL
Leukocytes,Ua: NEGATIVE
Nitrite: NEGATIVE
Protein, ur: NEGATIVE mg/dL
Specific Gravity, Urine: 1.035 — ABNORMAL HIGH (ref 1.005–1.030)
pH: 6 (ref 5.0–8.0)

## 2021-01-05 LAB — RESP PANEL BY RT-PCR (FLU A&B, COVID) ARPGX2
Influenza A by PCR: NEGATIVE
Influenza B by PCR: NEGATIVE
SARS Coronavirus 2 by RT PCR: NEGATIVE

## 2021-01-05 LAB — HEPATITIS PANEL, ACUTE
HCV Ab: REACTIVE — AB
Hep A IgM: NONREACTIVE
Hep B C IgM: NONREACTIVE
Hepatitis B Surface Ag: NONREACTIVE

## 2021-01-05 LAB — HIV ANTIBODY (ROUTINE TESTING W REFLEX): HIV Screen 4th Generation wRfx: NONREACTIVE

## 2021-01-05 SURGERY — APPENDECTOMY, LAPAROSCOPIC
Anesthesia: General

## 2021-01-05 MED ORDER — NICOTINE 14 MG/24HR TD PT24: 14.0000 mg | MEDICATED_PATCH | Freq: Every day | TRANSDERMAL | Status: DC

## 2021-01-05 MED ORDER — CHLORHEXIDINE GLUCONATE 0.12 % MT SOLN
OROMUCOSAL | Status: AC
  Administered 2021-01-05: 15 mL via OROMUCOSAL
  Filled 2021-01-05: qty 15

## 2021-01-05 MED ORDER — LORAZEPAM 2 MG/ML IJ SOLN: 0.5000 mg | INTRAMUSCULAR | Status: DC | PRN

## 2021-01-05 MED ORDER — BUPIVACAINE-EPINEPHRINE 0.25% -1:200000 IJ SOLN
INTRAMUSCULAR | Status: DC | PRN
  Administered 2021-01-05: 16 mL

## 2021-01-05 MED ORDER — PROPOFOL 10 MG/ML IV BOLUS
INTRAVENOUS | Status: AC
  Filled 2021-01-05: qty 20

## 2021-01-05 MED ORDER — MIDAZOLAM HCL 2 MG/2ML IJ SOLN
INTRAMUSCULAR | Status: AC
  Filled 2021-01-05: qty 2

## 2021-01-05 MED ORDER — FENTANYL CITRATE (PF) 250 MCG/5ML IJ SOLN
INTRAMUSCULAR | Status: DC | PRN
  Administered 2021-01-05: 100 ug via INTRAVENOUS

## 2021-01-05 MED ORDER — DEXMEDETOMIDINE (PRECEDEX) IN NS 20 MCG/5ML (4 MCG/ML) IV SYRINGE
PREFILLED_SYRINGE | INTRAVENOUS | Status: AC
  Filled 2021-01-05: qty 5

## 2021-01-05 MED ORDER — CHLORHEXIDINE GLUCONATE CLOTH 2 % EX PADS: 6.0000 | MEDICATED_PAD | Freq: Once | CUTANEOUS | Status: DC

## 2021-01-05 MED ORDER — SODIUM CHLORIDE 0.9 % IR SOLN
Status: DC | PRN
  Administered 2021-01-05: 1000 mL

## 2021-01-05 MED ORDER — ONDANSETRON HCL 4 MG/2ML IJ SOLN
INTRAMUSCULAR | Status: AC
  Filled 2021-01-05: qty 2

## 2021-01-05 MED ORDER — KETAMINE HCL 10 MG/ML IJ SOLN
INTRAMUSCULAR | Status: DC | PRN
  Administered 2021-01-05: 20 mg via INTRAVENOUS
  Administered 2021-01-05: 30 mg via INTRAVENOUS

## 2021-01-05 MED ORDER — ONDANSETRON HCL 4 MG/2ML IJ SOLN
INTRAMUSCULAR | Status: DC | PRN
  Administered 2021-01-05: 4 mg via INTRAVENOUS

## 2021-01-05 MED ORDER — LACTATED RINGERS IV SOLN: INTRAVENOUS | Status: DC

## 2021-01-05 MED ORDER — TRAMADOL HCL 50 MG PO TABS: 50.0000 mg | ORAL_TABLET | Freq: Four times a day (QID) | ORAL | 0 refills | Status: AC | PRN

## 2021-01-05 MED ORDER — DEXAMETHASONE SODIUM PHOSPHATE 10 MG/ML IJ SOLN
INTRAMUSCULAR | Status: AC
  Filled 2021-01-05: qty 1

## 2021-01-05 MED ORDER — ACETAMINOPHEN 500 MG PO TABS
ORAL_TABLET | ORAL | Status: AC
  Administered 2021-01-05: 1000 mg via ORAL
  Filled 2021-01-05: qty 2

## 2021-01-05 MED ORDER — LIDOCAINE 2% (20 MG/ML) 5 ML SYRINGE
INTRAMUSCULAR | Status: AC
  Filled 2021-01-05: qty 5

## 2021-01-05 MED ORDER — ACETAMINOPHEN 500 MG PO TABS: 1000.0000 mg | ORAL_TABLET | ORAL | Status: AC

## 2021-01-05 MED ORDER — ACETAMINOPHEN 10 MG/ML IV SOLN
INTRAVENOUS | Status: AC
  Filled 2021-01-05: qty 100

## 2021-01-05 MED ORDER — 0.9 % SODIUM CHLORIDE (POUR BTL) OPTIME
TOPICAL | Status: DC | PRN
  Administered 2021-01-05: 1000 mL

## 2021-01-05 MED ORDER — DOCUSATE SODIUM 100 MG PO CAPS: 100.0000 mg | ORAL_CAPSULE | Freq: Every day | ORAL | Status: AC | PRN

## 2021-01-05 MED ORDER — MIDAZOLAM HCL 2 MG/2ML IJ SOLN
INTRAMUSCULAR | Status: DC | PRN
  Administered 2021-01-05: 2 mg via INTRAVENOUS

## 2021-01-05 MED ORDER — CELECOXIB 200 MG PO CAPS: 400.0000 mg | ORAL_CAPSULE | ORAL | Status: DC

## 2021-01-05 MED ORDER — SODIUM CHLORIDE 0.9 % IV SOLN: INTRAVENOUS | Status: DC

## 2021-01-05 MED ORDER — FENTANYL CITRATE (PF) 250 MCG/5ML IJ SOLN
INTRAMUSCULAR | Status: AC
  Filled 2021-01-05: qty 5

## 2021-01-05 MED ORDER — IBUPROFEN 800 MG PO TABS: 800.0000 mg | ORAL_TABLET | Freq: Three times a day (TID) | ORAL | Status: AC | PRN

## 2021-01-05 MED ORDER — BUPIVACAINE-EPINEPHRINE (PF) 0.25% -1:200000 IJ SOLN
INTRAMUSCULAR | Status: AC
  Filled 2021-01-05: qty 30

## 2021-01-05 MED ORDER — METHADONE HCL 10 MG PO TABS
130.0000 mg | ORAL_TABLET | Freq: Every day | ORAL | Status: DC
  Administered 2021-01-05: 130 mg via ORAL
  Filled 2021-01-05: qty 13

## 2021-01-05 MED ORDER — ROCURONIUM BROMIDE 10 MG/ML (PF) SYRINGE
PREFILLED_SYRINGE | INTRAVENOUS | Status: AC
  Filled 2021-01-05: qty 10

## 2021-01-05 MED ORDER — GLYCOPYRROLATE PF 0.2 MG/ML IJ SOSY
PREFILLED_SYRINGE | INTRAMUSCULAR | Status: DC | PRN
  Administered 2021-01-05: .1 mg via INTRAVENOUS

## 2021-01-05 MED ORDER — DEXMEDETOMIDINE (PRECEDEX) IN NS 20 MCG/5ML (4 MCG/ML) IV SYRINGE
PREFILLED_SYRINGE | INTRAVENOUS | Status: DC | PRN
  Administered 2021-01-05: 4 ug via INTRAVENOUS
  Administered 2021-01-05: 8 ug via INTRAVENOUS
  Administered 2021-01-05: 4 ug via INTRAVENOUS

## 2021-01-05 MED ORDER — CHLORHEXIDINE GLUCONATE 0.12 % MT SOLN: 15.0000 mL | Freq: Once | OROMUCOSAL | Status: AC

## 2021-01-05 MED ORDER — CELECOXIB 200 MG PO CAPS
ORAL_CAPSULE | ORAL | Status: AC
  Filled 2021-01-05: qty 2

## 2021-01-05 MED ORDER — ORAL CARE MOUTH RINSE: 15.0000 mL | Freq: Once | OROMUCOSAL | Status: AC

## 2021-01-05 MED ORDER — ROCURONIUM BROMIDE 10 MG/ML (PF) SYRINGE
PREFILLED_SYRINGE | INTRAVENOUS | Status: DC | PRN
Start: 2021-01-05 — End: 2021-01-05
  Administered 2021-01-05: 10 mg via INTRAVENOUS
  Administered 2021-01-05: 80 mg via INTRAVENOUS

## 2021-01-05 MED ORDER — DEXAMETHASONE SODIUM PHOSPHATE 10 MG/ML IJ SOLN
INTRAMUSCULAR | Status: DC | PRN
  Administered 2021-01-05: 5 mg via INTRAVENOUS

## 2021-01-05 MED ORDER — KETAMINE HCL 50 MG/5ML IJ SOSY
PREFILLED_SYRINGE | INTRAMUSCULAR | Status: AC
  Filled 2021-01-05: qty 5

## 2021-01-05 MED ORDER — PROPOFOL 10 MG/ML IV BOLUS
INTRAVENOUS | Status: DC | PRN
  Administered 2021-01-05: 200 mg via INTRAVENOUS

## 2021-01-05 MED ORDER — LIDOCAINE 2% (20 MG/ML) 5 ML SYRINGE
INTRAMUSCULAR | Status: DC | PRN
  Administered 2021-01-05: 60 mg via INTRAVENOUS

## 2021-01-05 MED ORDER — SUGAMMADEX SODIUM 200 MG/2ML IV SOLN
INTRAVENOUS | Status: DC | PRN
  Administered 2021-01-05: 200 mg via INTRAVENOUS

## 2021-01-05 SURGICAL SUPPLY — 47 items
APPLIER CLIP ROT 10 11.4 M/L (STAPLE)
BAG COUNTER SPONGE SURGICOUNT (BAG) ×2 IMPLANT
BAG SURGICOUNT SPONGE COUNTING (BAG) ×1
BLADE CLIPPER SURG (BLADE) IMPLANT
CANISTER SUCT 3000ML PPV (MISCELLANEOUS) ×3 IMPLANT
CHLORAPREP W/TINT 26 (MISCELLANEOUS) ×3 IMPLANT
CLIP APPLIE ROT 10 11.4 M/L (STAPLE) IMPLANT
COVER SURGICAL LIGHT HANDLE (MISCELLANEOUS) ×3 IMPLANT
CUTTER FLEX LINEAR 45M (STAPLE) ×3 IMPLANT
DERMABOND ADVANCED (GAUZE/BANDAGES/DRESSINGS) ×2
DERMABOND ADVANCED .7 DNX12 (GAUZE/BANDAGES/DRESSINGS) ×1 IMPLANT
ELECT REM PT RETURN 9FT ADLT (ELECTROSURGICAL) ×3
ELECTRODE REM PT RTRN 9FT ADLT (ELECTROSURGICAL) ×1 IMPLANT
GAUZE 4X4 16PLY ~~LOC~~+RFID DBL (SPONGE) ×3 IMPLANT
GLOVE SRG 8 PF TXTR STRL LF DI (GLOVE) ×1 IMPLANT
GLOVE SURG ENC MOIS LTX SZ8 (GLOVE) ×3 IMPLANT
GLOVE SURG UNDER POLY LF SZ8 (GLOVE) ×2
GOWN STRL REUS W/ TWL LRG LVL3 (GOWN DISPOSABLE) ×2 IMPLANT
GOWN STRL REUS W/ TWL XL LVL3 (GOWN DISPOSABLE) ×1 IMPLANT
GOWN STRL REUS W/TWL LRG LVL3 (GOWN DISPOSABLE) ×4
GOWN STRL REUS W/TWL XL LVL3 (GOWN DISPOSABLE) ×2
KIT BASIN OR (CUSTOM PROCEDURE TRAY) ×3 IMPLANT
KIT TURNOVER KIT B (KITS) ×3 IMPLANT
NEEDLE 22X1 1/2 (OR ONLY) (NEEDLE) ×3 IMPLANT
NS IRRIG 1000ML POUR BTL (IV SOLUTION) ×3 IMPLANT
PAD ARMBOARD 7.5X6 YLW CONV (MISCELLANEOUS) ×6 IMPLANT
POUCH RETRIEVAL ECOSAC 10 (ENDOMECHANICALS) ×1 IMPLANT
POUCH RETRIEVAL ECOSAC 10MM (ENDOMECHANICALS) ×2
RELOAD 45 VASCULAR/THIN (ENDOMECHANICALS) ×3 IMPLANT
RELOAD STAPLE TA45 3.5 REG BLU (ENDOMECHANICALS) IMPLANT
SCISSORS LAP 5X35 DISP (ENDOMECHANICALS) IMPLANT
SET IRRIG TUBING LAPAROSCOPIC (IRRIGATION / IRRIGATOR) ×3 IMPLANT
SET TUBE SMOKE EVAC HIGH FLOW (TUBING) ×3 IMPLANT
SHEARS HARMONIC ACE PLUS 36CM (ENDOMECHANICALS) ×3 IMPLANT
SPECIMEN JAR SMALL (MISCELLANEOUS) ×3 IMPLANT
SPONGE T-LAP 18X18 ~~LOC~~+RFID (SPONGE) ×3 IMPLANT
SUT VIC AB 4-0 PS2 27 (SUTURE) ×3 IMPLANT
SUT VICRYL 4-0 PS2 18IN ABS (SUTURE) ×6 IMPLANT
TOWEL GREEN STERILE (TOWEL DISPOSABLE) ×3 IMPLANT
TOWEL GREEN STERILE FF (TOWEL DISPOSABLE) ×3 IMPLANT
TRAY FOLEY W/BAG SLVR 16FR (SET/KITS/TRAYS/PACK) ×2
TRAY FOLEY W/BAG SLVR 16FR ST (SET/KITS/TRAYS/PACK) ×1 IMPLANT
TRAY LAPAROSCOPIC MC (CUSTOM PROCEDURE TRAY) ×3 IMPLANT
TROCAR XCEL 12X100 BLDLESS (ENDOMECHANICALS) ×3 IMPLANT
TROCAR XCEL BLUNT TIP 100MML (ENDOMECHANICALS) ×3 IMPLANT
TROCAR XCEL NON-BLD 5MMX100MML (ENDOMECHANICALS) ×3 IMPLANT
WATER STERILE IRR 1000ML POUR (IV SOLUTION) ×3 IMPLANT

## 2021-01-05 NOTE — Op Note (Signed)
  01/04/2021 - 01/05/2021  1:25 PM  PATIENT:  Bob Fowler  30 y.o. male  PRE-OPERATIVE DIAGNOSIS:  APPENDICITIS  POST-OPERATIVE DIAGNOSIS:  APPENDICITIS  PROCEDURE:  Procedure(s): APPENDECTOMY LAPAROSCOPIC  SURGEON:  Surgeon(s): Violeta Gelinas, MD  ASSISTANTS: none   ANESTHESIA:   local and general  EBL:  Total I/O In: 700 [I.V.:700] Out: -   BLOOD ADMINISTERED:none  DRAINS: none   SPECIMEN:  Excision  DISPOSITION OF SPECIMEN:  PATHOLOGY  COUNTS:  YES  DICTATION: .Dragon Dictation Findings: Acute suppurative but not perforated appendicitis  Procedure in detail: Informed consent was obtained.  He received intravenous antibiotics.  He was brought to the operating room and general endotracheal anesthesia was administered by the anesthesia staff.  Foley catheter was placed by nursing.  His abdomen was prepped and draped in sterile fashion.  Timeout procedure was performed.The infraumbilical region was infiltrated with local. Infraumbilical incision was made. Subcutaneous tissues were dissected down revealing the anterior fascia. This was divided sharply along the midline. Peritoneal cavity was entered under direct vision without complication. A 0 Vicryl pursestring was placed around the fascial opening. Hassan trocar was inserted into the abdomen. The abdomen was insufflated with carbon dioxide in standard fashion. Under direct vision a 12 mm left lower quadrant and a 5 mm right mid abdomen port were placed.  Local was used at each port site.  Laparoscopic exploration revealed an inflamed suppurative appendix.  There was no perforation.  The mesoappendix was gradually taken down with harmonic scalpel achieving excellent hemostasis down to the base.  Once the appendix was skeletonized the base was divided with Endo GIA with a vascular load.  The appendix was placed in a bag and removed from the abdomen.  It was sent to pathology.  The base staple line was intact.  There was excellent  hemostasis.  The area was copiously irrigated.  Irrigation fluid was evacuated.  The staple line remained intact.  Four-quadrant inspection revealed no complicating features.  Ports were removed under direct vision.  Pneumoperitoneum was released.  Supraumbilical fascia was closed by tying the pursestring.  All 3 wounds were irrigated and the skin of each was closed with running 4-0 Vicryl followed by Dermabond.  All counts were correct.  He tolerated the procedure well without apparent complication and was taken recovery in stable condition.  PATIENT DISPOSITION:  PACU - hemodynamically stable.   Delay start of Pharmacological VTE agent (>24hrs) due to surgical blood loss or risk of bleeding:  no  Violeta Gelinas, MD, MPH, FACS Pager: [phone]  11/8/20221:25 PM

## 2021-01-05 NOTE — Progress Notes (Signed)
Patient ID: Bob Fowler, male   DOB: 1991/01/28, 30 y.o.   MRN: [id number] Day of Surgery   Subjective: Pain RLQ ROS negative except as listed above. Objective: Vital signs in last 24 hours: Temp:  [98.4 F (36.9 C)-98.7 F (37.1 C)] 98.7 F (37.1 C) (11/08 0117) Pulse Rate:  [50-73] 68 (11/08 0745) Resp:  [13-23] 22 (11/08 0745) BP: (106-132)/(55-82) 117/70 (11/08 0745) SpO2:  [94 %-100 %] 96 % (11/08 0745)    Intake/Output from previous day: No intake/output data recorded. Intake/Output this shift: No intake/output data recorded.  General appearance: alert and cooperative Resp: clear to auscultation bilaterally Cardio: regular rate and rhythm GI: soft, tender RLQ Extremities: calves soft  Lab Results: CBC  Recent Labs    01/04/21 1314  WBC 12.4*  HGB 14.8  HCT 45.5  PLT 243   BMET Recent Labs    01/04/21 1314  NA 140  K 4.4  CL 105  CO2 25  GLUCOSE 80  BUN 7  CREATININE 0.84  CALCIUM 9.4   PT/INR No results for input(s): LABPROT, INR in the last 72 hours. ABG No results for input(s): PHART, HCO3 in the last 72 hours.  Invalid input(s): PCO2, PO2  Studies/Results: CT ABDOMEN PELVIS W CONTRAST  Result Date: 01/04/2021 CLINICAL DATA:  Right lower quadrant pain, nausea/vomiting EXAM: CT ABDOMEN AND PELVIS WITH CONTRAST TECHNIQUE: Multidetector CT imaging of the abdomen and pelvis was performed using the standard protocol following bolus administration of intravenous contrast. CONTRAST:  OMNIPAQUE IOHEXOL 300 MG/ML  SOLN COMPARISON:  None. FINDINGS: Lower chest: Minimal subpleural reticulation at the lung bases. Hepatobiliary: Liver is within normal limits, noting focal fat altered perfusion along the falciform ligament. Gallbladder is unremarkable. No intrahepatic or extrahepatic ductal dilatation. Pancreas: Within normal limits. Spleen: Within normal limits. Adrenals/Urinary Tract: Adrenal glands are within normal limits. Kidneys are within normal  limits.  No hydronephrosis. Bladder is within normal limits. Stomach/Bowel: Stomach is within normal limits. No evidence of bowel obstruction. Dilated proximal appendix, measuring 16 mm, with periappendiceal inflammatory changes, reflecting acute appendicitis (series 3/image 62). Associated small distal appendicolith (series 3/image 83). No drainable fluid collection/abscess. No free air to suggest macroscopic perforation. Vascular/Lymphatic: No evidence of abdominal aortic aneurysm. No suspicious abdominopelvic lymphadenopathy. Reproductive: Prostate is unremarkable. Other: No abdominopelvic ascites. Musculoskeletal: Visualized osseous structures are within normal limits. IMPRESSION: Acute appendicitis with distal appendicolith. No drainable fluid collection/abscess. No free air to suggest macroscopic perforation. Electronically Signed   By: Charline Bills M.D.   On: 01/04/2021 21:56    Anti-infectives: Anti-infectives (From admission, onward)    Start     Dose/Rate Route Frequency Ordered Stop   01/04/21 2330  [MAR Hold]  piperacillin-tazobactam (ZOSYN) IVPB 3.375 g        (MAR Hold since Tue 01/05/2021 at 1100.Hold Reason: Transfer to a Procedural area)   3.375 g 12.5 mL/hr over 240 Minutes Intravenous Every 8 hours 01/04/21 2320 01/11/21 2159   01/04/21 2300  piperacillin-tazobactam (ZOSYN) IVPB 3.375 g  Status:  Discontinued        3.375 g 12.5 mL/hr over 240 Minutes Intravenous Every 8 hours 01/04/21 2248 01/04/21 2324       Assessment/Plan: Appendicitis - for laparoscopic appendectomy today. Procedure, risks, and benefits explained and I also discussed the expected post op course. He agrees.   LOS: 0 days    Violeta Gelinas, MD, MPH, FACS Trauma & General Surgery Use [email or web address] to contact on call provider  01/05/2021

## 2021-01-05 NOTE — Transfer of Care (Signed)
Immediate Anesthesia Transfer of Care Note  Patient: Bob Fowler  Procedure(s) Performed: APPENDECTOMY LAPAROSCOPIC  Patient Location: PACU  Anesthesia Type:General  Level of Consciousness: awake and alert   Airway & Oxygen Therapy: Patient Spontanous Breathing  Post-op Assessment: Report given to RN and Post -op Vital signs reviewed and stable  Post vital signs: Reviewed and stable  Last Vitals:  Vitals Value Taken Time  BP 127/60   Temp    Pulse 75   Resp 18   SpO2 100     Last Pain:  Vitals:   01/05/21 1114  TempSrc:   PainSc: 9          Complications: No notable events documented.

## 2021-01-05 NOTE — Progress Notes (Signed)
Progress Note     Subjective: Patient requesting daily dosage of methadone and has spoken with his clinic and given permission for me to call and confirm dosage. Mother at bedside.   Objective: Vital signs in last 24 hours: Temp:  [98.4 F (36.9 C)-98.7 F (37.1 C)] 98.7 F (37.1 C) (11/08 0117) Pulse Rate:  [50-73] 68 (11/08 0745) Resp:  [13-23] 22 (11/08 0745) BP: (106-132)/(55-82) 117/70 (11/08 0745) SpO2:  [94 %-100 %] 96 % (11/08 0745)    Intake/Output from previous day: No intake/output data recorded. Intake/Output this shift: No intake/output data recorded.  PE: General: pleasant, WD, WN male who is laying in bed in NAD HEENT: sclera anicterc Lungs: Respiratory effort nonlabored MS: all 4 extremities are symmetrical with no cyanosis, clubbing, or edema. Skin: warm and dry with no masses, lesions, or rashes Neuro: Cranial nerves 2-12 grossly intact, sensation is normal throughout Psych: A&Ox3 with an appropriate affect.    Lab Results:  Recent Labs    01/04/21 1314  WBC 12.4*  HGB 14.8  HCT 45.5  PLT 243   BMET Recent Labs    01/04/21 1314  NA 140  K 4.4  CL 105  CO2 25  GLUCOSE 80  BUN 7  CREATININE 0.84  CALCIUM 9.4   PT/INR No results for input(s): LABPROT, INR in the last 72 hours. CMP     Component Value Date/Time   NA 140 01/04/2021 1314   NA 139 05/11/2017 1719   K 4.4 01/04/2021 1314   CL 105 01/04/2021 1314   CO2 25 01/04/2021 1314   GLUCOSE 80 01/04/2021 1314   BUN 7 01/04/2021 1314   BUN 11 05/11/2017 1719   CREATININE 0.84 01/04/2021 1314   CREATININE 0.83 12/21/2017 1514   CALCIUM 9.4 01/04/2021 1314   PROT 7.7 01/04/2021 1314   PROT 7.4 05/11/2017 1719   ALBUMIN 4.0 01/04/2021 1314   ALBUMIN 4.3 05/11/2017 1719   AST 23 01/04/2021 1314   ALT 26 01/04/2021 1314   ALT 254 (H) 05/22/2017 1119   ALKPHOS 104 01/04/2021 1314   BILITOT 0.8 01/04/2021 1314   BILITOT 0.7 05/11/2017 1719   GFRNONAA >60 01/04/2021 1314    GFRNONAA 121 12/21/2017 1514   GFRAA 141 12/21/2017 1514   Lipase     Component Value Date/Time   LIPASE 27 01/04/2021 1314       Studies/Results: CT ABDOMEN PELVIS W CONTRAST  Result Date: 01/04/2021 CLINICAL DATA:  Right lower quadrant pain, nausea/vomiting EXAM: CT ABDOMEN AND PELVIS WITH CONTRAST TECHNIQUE: Multidetector CT imaging of the abdomen and pelvis was performed using the standard protocol following bolus administration of intravenous contrast. CONTRAST:  OMNIPAQUE IOHEXOL 300 MG/ML  SOLN COMPARISON:  None. FINDINGS: Lower chest: Minimal subpleural reticulation at the lung bases. Hepatobiliary: Liver is within normal limits, noting focal fat altered perfusion along the falciform ligament. Gallbladder is unremarkable. No intrahepatic or extrahepatic ductal dilatation. Pancreas: Within normal limits. Spleen: Within normal limits. Adrenals/Urinary Tract: Adrenal glands are within normal limits. Kidneys are within normal limits.  No hydronephrosis. Bladder is within normal limits. Stomach/Bowel: Stomach is within normal limits. No evidence of bowel obstruction. Dilated proximal appendix, measuring 16 mm, with periappendiceal inflammatory changes, reflecting acute appendicitis (series 3/image 62). Associated small distal appendicolith (series 3/image 83). No drainable fluid collection/abscess. No free air to suggest macroscopic perforation. Vascular/Lymphatic: No evidence of abdominal aortic aneurysm. No suspicious abdominopelvic lymphadenopathy. Reproductive: Prostate is unremarkable. Other: No abdominopelvic ascites. Musculoskeletal: Visualized osseous structures are  within normal limits. IMPRESSION: Acute appendicitis with distal appendicolith. No drainable fluid collection/abscess. No free air to suggest macroscopic perforation. Electronically Signed   By: Charline Bills M.D.   On: 01/04/2021 21:56    Anti-infectives: Anti-infectives (From admission, onward)    Start      Dose/Rate Route Frequency Ordered Stop   01/04/21 2330  piperacillin-tazobactam (ZOSYN) IVPB 3.375 g        3.375 g 12.5 mL/hr over 240 Minutes Intravenous Every 8 hours 01/04/21 2320 01/11/21 2159   01/04/21 2300  piperacillin-tazobactam (ZOSYN) IVPB 3.375 g  Status:  Discontinued        3.375 g 12.5 mL/hr over 240 Minutes Intravenous Every 8 hours 01/04/21 2248 01/04/21 2324        Assessment/Plan Acute appendicitis  - CT with acute appendicitis  - WBC 12 - to OR later today for laparoscopic appendectomy - possible discharge after surgery pending operative findings   FEN: NPO, IVF @75cc /h VTE: LMWH ID: Zosyn 11/7>>  Hx of Hep C 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

## 2021-01-05 NOTE — Discharge Instructions (Signed)
[facility] LAPAROSCOPIC SURGERY: POST OP INSTRUCTIONS Always review your discharge instruction sheet given to you by the facility where your surgery was performed. IF YOU HAVE DISABILITY OR FAMILY LEAVE FORMS, YOU MUST BRING THEM TO THE OFFICE FOR PROCESSING.   DO NOT GIVE THEM TO YOUR DOCTOR.  PAIN CONTROL  First take acetaminophen (Tylenol) AND/or ibuprofen (Advil) to control your pain after surgery.  Follow directions on package.  Taking acetaminophen (Tylenol) and/or ibuprofen (Advil) regularly after surgery will help to control your pain and lower the amount of prescription pain medication you may need.  You should not take more than 3,000 mg (3 grams) of acetaminophen (Tylenol) in 24 hours.  You should not take ibuprofen (Advil), aleve, motrin, naprosyn or other NSAIDS if you have a history of stomach ulcers or chronic kidney disease.  A prescription for pain medication may be given to you upon discharge.  Take your pain medication as prescribed, if you still have uncontrolled pain after taking acetaminophen (Tylenol) or ibuprofen (Advil). Use ice packs to help control pain. If you need a refill on your pain medication, please contact your pharmacy.  They will contact our office to request authorization. Prescriptions will not be filled after 5pm or on week-ends.  HOME MEDICATIONS Take your usually prescribed medications unless otherwise directed.  DIET You should follow a light diet the first few days after arrival home.  Be sure to include lots of fluids daily. Avoid fatty, fried foods.   CONSTIPATION It is common to experience some constipation after surgery and if you are taking pain medication.  Increasing fluid intake and taking a stool softener (such as Colace) will usually help or prevent this problem from occurring.  A mild laxative (Milk of Magnesia or Miralax) should be taken according to package instructions if there are no bowel movements after 48  hours.  WOUND/INCISION CARE Most patients will experience some swelling and bruising in the area of the incisions.  Ice packs will help.  Swelling and bruising can take several days to resolve.  Unless discharge instructions indicate otherwise, follow guidelines below  STERI-STRIPS - you may remove your outer bandages 48 hours after surgery, and you may shower at that time.  You have steri-strips (small skin tapes) in place directly over the incision.  These strips should be left on the skin for 7-10 days.   DERMABOND/SKIN GLUE - you may shower in 24 hours.  The glue will flake off over the next 2-3 weeks. Any sutures or staples will be removed at the office during your follow-up visit.  ACTIVITIES You may resume regular (light) daily activities beginning the next day--such as daily self-care, walking, climbing stairs--gradually increasing activities as tolerated.  You may have sexual intercourse when it is comfortable.  Refrain from any heavy lifting or straining until approved by your doctor. You may drive when you are no longer taking prescription pain medication, you can comfortably wear a seatbelt, and you can safely maneuver your car and apply brakes.  FOLLOW-UP You should see your doctor in the office for a follow-up appointment approximately 2-3 weeks after your surgery.  You should have been given your post-op/follow-up appointment when your surgery was scheduled.  If you did not receive a post-op/follow-up appointment, make sure that you call for this appointment within a day or two after you arrive home to insure a convenient appointment time.   WHEN TO CALL YOUR DOCTOR: Fever over 101.0 Inability to urinate Continued bleeding from incision.   Increased pain, redness, or drainage from the incision. Increasing abdominal pain  The clinic staff is available to answer your questions during regular business hours.  Please don't hesitate to call and ask to speak to one of the nurses for  clinical concerns.  If you have a medical emergency, go to the nearest emergency room or call 911.  A surgeon from [facility] is always on call at the hospital. [address], [address], [place], [place]  [address] ? [address] [address], [place], [place]   [address] [phone] ? [phone] ? FAX [phone] Web site: [email or web address]      Managing Your Pain After Surgery Without Opioids    Thank you for participating in our program to help patients manage their pain after surgery without opioids. This is part of our effort to provide you with the best care possible, without exposing you or your family to the risk that opioids pose.  What pain can I expect after surgery? You can expect to have some pain after surgery. This is normal. The pain is typically worse the day after surgery, and quickly begins to get better. Many studies have found that many patients are able to manage their pain after surgery with Over-the-Counter (OTC) medications such as Tylenol and Motrin. If you have a condition that does not allow you to take Tylenol or Motrin, notify your surgical team.  How will I manage my pain? The best strategy for controlling your pain after surgery is around the clock pain control with Tylenol (acetaminophen) and Motrin (ibuprofen or Advil). Alternating these medications with each other allows you to maximize your pain control. In addition to Tylenol and Motrin, you can use heating pads or ice packs on your incisions to help reduce your pain.  How will I alternate your regular strength over-the-counter pain medication? You will take a dose of pain medication every three hours. Start by taking 650 mg of Tylenol (2 pills of 325 mg) 3 hours later take 600 mg of Motrin (3 pills of 200 mg) 3 hours after taking the Motrin take 650 mg of Tylenol 3 hours after that take 600 mg of Motrin.   - 1 -  See example - if your first dose of Tylenol is at 12:00  PM   12:00 PM Tylenol 650 mg (2 pills of 325 mg)  3:00 PM Motrin 600 mg (3 pills of 200 mg)  6:00 PM Tylenol 650 mg (2 pills of 325 mg)  9:00 PM Motrin 600 mg (3 pills of 200 mg)  Continue alternating every 3 hours   We recommend that you follow this schedule around-the-clock for at least 3 days after surgery, or until you feel that it is no longer needed. Use the table on the last page of this handout to keep track of the medications you are taking. Important: Do not take more than 3000mg of Tylenol or 3200mg of Motrin in a 24-hour period. Do not take ibuprofen/Motrin if you have a history of bleeding stomach ulcers, severe kidney disease, &/or actively taking a blood thinner  What if I still have pain? If you have pain that is not controlled with the over-the-counter pain medications (Tylenol and Motrin or Advil) you might have what we call "breakthrough" pain. You will receive a prescription for a small amount of an opioid pain medication such as Oxycodone, Tramadol, or Tylenol with Codeine. Use these opioid pills in the first 24 hours after surgery if you have breakthrough pain. Do   not take more than 1 pill every 4-6 hours.  If you still have uncontrolled pain after using all opioid pills, don't hesitate to call our staff using the number provided. We will help make sure you are managing your pain in the best way possible, and if necessary, we can provide a prescription for additional pain medication.   Day 1    Time  Name of Medication Number of pills taken  Amount of Acetaminophen  Pain Level   Comments  AM PM       AM PM       AM PM       AM PM       AM PM       AM PM       AM PM       AM PM       Total Daily amount of Acetaminophen Do not take more than  3,000 mg per day      Day 2    Time  Name of Medication Number of pills taken  Amount of Acetaminophen  Pain Level   Comments  AM PM       AM PM       AM PM       AM PM       AM PM       AM PM       AM  PM       AM PM       Total Daily amount of Acetaminophen Do not take more than  3,000 mg per day      Day 3    Time  Name of Medication Number of pills taken  Amount of Acetaminophen  Pain Level   Comments  AM PM       AM PM       AM PM       AM PM          AM PM       AM PM       AM PM       AM PM       Total Daily amount of Acetaminophen Do not take more than  3,000 mg per day      Day 4    Time  Name of Medication Number of pills taken  Amount of Acetaminophen  Pain Level   Comments  AM PM       AM PM       AM PM       AM PM       AM PM       AM PM       AM PM       AM PM       Total Daily amount of Acetaminophen Do not take more than  3,000 mg per day      Day 5    Time  Name of Medication Number of pills taken  Amount of Acetaminophen  Pain Level   Comments  AM PM       AM PM       AM PM       AM PM       AM PM       AM PM       AM PM       AM PM       Total Daily amount of Acetaminophen Do not take more than    3,000 mg per day       Day 6    Time  Name of Medication Number of pills taken  Amount of Acetaminophen  Pain Level  Comments  AM PM       AM PM       AM PM       AM PM       AM PM       AM PM       AM PM       AM PM       Total Daily amount of Acetaminophen Do not take more than  3,000 mg per day      Day 7    Time  Name of Medication Number of pills taken  Amount of Acetaminophen  Pain Level   Comments  AM PM       AM PM       AM PM       AM PM       AM PM       AM PM       AM PM       AM PM       Total Daily amount of Acetaminophen Do not take more than  3,000 mg per day        For additional information about how and where to safely dispose of unused opioid medications - https://www.morepowerfulnc.org  Disclaimer: This document contains information and/or instructional materials adapted from [facility] for the typical patient with your condition. It does not replace medical advice  from your health care provider because your experience may differ from that of the typical patient. Talk to your health care provider if you have any questions about this document, your condition or your treatment plan. Adapted from [facility]  

## 2021-01-05 NOTE — Anesthesia Procedure Notes (Signed)
Procedure Name: Intubation Date/Time: 01/05/2021 12:29 PM Performed by: Lorie Phenix, CRNA Pre-anesthesia Checklist: Patient identified, Emergency Drugs available, Suction available and Patient being monitored Patient Re-evaluated:Patient Re-evaluated prior to induction Oxygen Delivery Method: Circle system utilized Preoxygenation: Pre-oxygenation with 100% oxygen Induction Type: IV induction Ventilation: Mask ventilation without difficulty Laryngoscope Size: Mac and 4 Grade View: Grade I Tube type: Oral Number of attempts: 1 Airway Equipment and Method: Stylet and Oral airway Placement Confirmation: ETT inserted through vocal cords under direct vision, positive ETCO2 and breath sounds checked- equal and bilateral Secured at: 22 cm Tube secured with: Tape Dental Injury: Teeth and Oropharynx as per pre-operative assessment

## 2021-01-05 NOTE — Anesthesia Preprocedure Evaluation (Signed)
Anesthesia Evaluation  Patient identified by MRN, date of birth, ID band Patient awake    Reviewed: Allergy & Precautions, NPO status , Patient's Chart, lab work & pertinent test results  History of Anesthesia Complications Negative for: history of anesthetic complications  Airway Mallampati: II  TM Distance: >3 FB Neck ROM: Full    Dental  (+) Dental Advisory Given, Teeth Intact   Pulmonary neg shortness of breath, neg sleep apnea, neg COPD, neg recent URI, Current SmokerPatient did not abstain from smoking.,    breath sounds clear to auscultation       Cardiovascular negative cardio ROS   Rhythm:Regular     Neuro/Psych PSYCHIATRIC DISORDERS Anxiety negative neurological ROS     GI/Hepatic (+) Hepatitis -, CHep C treated per patient  H/o heroin use now on methadone--> took today's dose APPENDICITIS   Endo/Other  negative endocrine ROS  Renal/GU negative Renal ROS     Musculoskeletal negative musculoskeletal ROS (+)   Abdominal   Peds  Hematology negative hematology ROS (+)   Anesthesia Other Findings   Reproductive/Obstetrics                             Anesthesia Physical Anesthesia Plan  ASA: 2  Anesthesia Plan: General   Post-op Pain Management:    Induction: Intravenous  PONV Risk Score and Plan: 1 and Dexamethasone and Ondansetron  Airway Management Planned: Oral ETT  Additional Equipment: None  Intra-op Plan:   Post-operative Plan: Extubation in OR  Informed Consent: I have reviewed the patients History and Physical, chart, labs and discussed the procedure including the risks, benefits and alternatives for the proposed anesthesia with the patient or authorized representative who has indicated his/her understanding and acceptance.     Dental advisory given  Plan Discussed with: CRNA and Anesthesiologist  Anesthesia Plan Comments:         Anesthesia  Quick Evaluation

## 2021-01-06 ENCOUNTER — Encounter (HOSPITAL_COMMUNITY): Payer: Self-pay | Admitting: General Surgery

## 2021-01-06 NOTE — Anesthesia Postprocedure Evaluation (Signed)
Anesthesia Post Note  Patient: Bob Fowler  Procedure(s) Performed: APPENDECTOMY LAPAROSCOPIC     Patient location during evaluation: PACU Anesthesia Type: General Level of consciousness: awake and alert Pain management: pain level controlled Vital Signs Assessment: post-procedure vital signs reviewed and stable Respiratory status: spontaneous breathing, nonlabored ventilation, respiratory function stable and patient connected to nasal cannula oxygen Cardiovascular status: blood pressure returned to baseline and stable Postop Assessment: no apparent nausea or vomiting Anesthetic complications: no   No notable events documented.  Last Vitals:  Vitals:   01/05/21 1345 01/05/21 1400  BP: 122/80 134/71  Pulse: (!) 59 (!) 55  Resp: 11 17  Temp:  36.7 C  SpO2: 100% 100%    Last Pain:  Vitals:   01/05/21 1400  TempSrc:   PainSc: 0-No pain                 Lavarius Doughten

## 2021-01-06 NOTE — Discharge Summary (Signed)
[facility] Discharge Summary   Patient ID: Bob Fowler MRN: [id number] DOB/AGE: 30-18-1992 30 y.o.  Admit date: 01/04/2021 Discharge date: 01/05/2021  Admitting Diagnosis: Appendicitis [K37] Acute appendicitis with localized peritonitis, without perforation, abscess, or gangrene [K35.30]   Discharge Diagnosis Patient Active Problem List   Diagnosis Date Noted   History of laparoscopic appendectomy 01/04/2021   History of narcotic addiction ([place]) 08/27/2014   Consultants None  Imaging: CT ABDOMEN PELVIS W CONTRAST  Result Date: 01/04/2021 CLINICAL DATA:  Right lower quadrant pain, nausea/vomiting EXAM: CT ABDOMEN AND PELVIS WITH CONTRAST TECHNIQUE: Multidetector CT imaging of the abdomen and pelvis was performed using the standard protocol following bolus administration of intravenous contrast. CONTRAST:  125m OMNIPAQUE IOHEXOL 300 MG/ML  SOLN COMPARISON:  None. FINDINGS: Lower chest: Minimal subpleural reticulation at the lung bases. Hepatobiliary: Liver is within normal limits, noting focal fat altered perfusion along the falciform ligament. Gallbladder is unremarkable. No intrahepatic or extrahepatic ductal dilatation. Pancreas: Within normal limits. Spleen: Within normal limits. Adrenals/Urinary Tract: Adrenal glands are within normal limits. Kidneys are within normal limits.  No hydronephrosis. Bladder is within normal limits. Stomach/Bowel: Stomach is within normal limits. No evidence of bowel obstruction. Dilated proximal appendix, measuring 16 mm, with periappendiceal inflammatory changes, reflecting acute appendicitis (series 3/image 62). Associated small distal appendicolith (series 3/image 83). No drainable fluid collection/abscess. No free air to suggest macroscopic perforation. Vascular/Lymphatic: No evidence of abdominal aortic aneurysm. No suspicious abdominopelvic lymphadenopathy. Reproductive: Prostate is unremarkable. Other: No abdominopelvic ascites.  Musculoskeletal: Visualized osseous structures are within normal limits. IMPRESSION: Acute appendicitis with distal appendicolith. No drainable fluid collection/abscess. No free air to suggest macroscopic perforation. Electronically Signed   By: SJulian HyM.D.   On: 01/04/2021 21:56    Procedures Dr. TGrandville Silos(01/05/21) - Laparoscopic Appendectomy  Hospital Course:  30[facility].  Workup showed acute appendicitis without perforation.  Patient was admitted and underwent procedure listed above.  Tolerated procedure well and was transferred to the PACU.  Following his procedure met criteria for safe discharge from PACU. His pain was well controlled, vital signs stable, incisions c/d/i and felt stable for discharge home.  Patient will follow up in our office in 4 weeks and knows to call with questions or concerns.   His methadone clinic was contacted prior to discharge and he will follow up with them. He was discharged with tramadol for pain control to alternate with ibuprofen and tylenol.  I or a member of my team have reviewed this patient in the Controlled Substance Database.  I was not directly involved in this patient's care therefore the information in this discharge summary was taken from the chart.   Allergies as of 01/05/2021   No Known Allergies      Medication List     TAKE these medications    acetaminophen 500 MG tablet Commonly known as: TYLENOL Take 1,000 mg by mouth every 6 (six) hours as needed for mild pain.   docusate sodium 100 MG capsule Commonly known as: Colace Take 1 capsule (100 mg total) by mouth daily as needed for mild constipation or moderate constipation.   ibuprofen 800 MG tablet Commonly known as: ADVIL Take 1 tablet (800 mg total) by mouth every 8 (eight) hours as needed for up to 5 days for mild pain or moderate pain.   methadone 10 MG/5ML solution Commonly known as:  DOLOPHINE Take 130 mg by mouth daily.  traMADol 50 MG tablet Commonly known as: Ultram Take 1 tablet (50 mg total) by mouth every 6 (six) hours as needed for up to 5 days.          Follow-up Information     [facility], [place]. Go on 02/04/2021.   Specialty: General Surgery Why: follow up on 02/04/21 at 1:15 pm. Please arrive 30 minutes early to complete check in process and bring photo id and insurance card if you have them Contact information: [address] [place] [address] [phone]                 Signed: Winferd Humphrey , [facility] 01/06/2021, 2:59 PM Please see Amion for pager number during day hours 7:00am-4:30pm

## 2021-01-08 LAB — SURGICAL PATHOLOGY

## 2021-11-23 ENCOUNTER — Emergency Department (HOSPITAL_COMMUNITY): Payer: Managed Care, Other (non HMO)

## 2021-11-23 ENCOUNTER — Emergency Department (HOSPITAL_COMMUNITY)
Admission: EM | Admit: 2021-11-23 | Discharge: 2021-11-24 | Disposition: A | Discharge status: Home / Self Care | Payer: Managed Care, Other (non HMO) | Attending: Emergency Medicine | Admitting: Emergency Medicine

## 2021-11-23 ENCOUNTER — Other Ambulatory Visit: Payer: Self-pay

## 2021-11-23 ENCOUNTER — Encounter (HOSPITAL_COMMUNITY): Payer: Self-pay | Admitting: Emergency Medicine

## 2021-11-23 DIAGNOSIS — R112 Nausea with vomiting, unspecified: Secondary | ICD-10-CM | POA: Diagnosis not present

## 2021-11-23 DIAGNOSIS — R0789 Other chest pain: Secondary | ICD-10-CM | POA: Diagnosis present

## 2021-11-23 LAB — CBC WITH DIFFERENTIAL/PLATELET
Abs Immature Granulocytes: 0.04 10*3/uL (ref 0.00–0.07)
Basophils Absolute: 0 10*3/uL (ref 0.0–0.1)
Basophils Relative: 0 %
Eosinophils Absolute: 0.1 10*3/uL (ref 0.0–0.5)
Eosinophils Relative: 1 %
HCT: 42.1 % (ref 39.0–52.0)
Hemoglobin: 14 g/dL (ref 13.0–17.0)
Immature Granulocytes: 0 %
Lymphocytes Relative: 24 %
Lymphs Abs: 2.2 10*3/uL (ref 0.7–4.0)
MCH: 26.8 pg (ref 26.0–34.0)
MCHC: 33.3 g/dL (ref 30.0–36.0)
MCV: 80.5 fL (ref 80.0–100.0)
Monocytes Absolute: 0.8 10*3/uL (ref 0.1–1.0)
Monocytes Relative: 9 %
Neutro Abs: 6.1 10*3/uL (ref 1.7–7.7)
Neutrophils Relative %: 66 %
Platelets: 256 10*3/uL (ref 150–400)
RBC: 5.23 MIL/uL (ref 4.22–5.81)
RDW: 14.1 % (ref 11.5–15.5)
WBC: 9.2 10*3/uL (ref 4.0–10.5)
nRBC: 0 % (ref 0.0–0.2)

## 2021-11-23 LAB — COMPREHENSIVE METABOLIC PANEL
ALT: 33 U/L (ref 0–44)
AST: 33 U/L (ref 15–41)
Albumin: 4.2 g/dL (ref 3.5–5.0)
Alkaline Phosphatase: 107 U/L (ref 38–126)
Anion gap: 10 (ref 5–15)
BUN: 6 mg/dL (ref 6–20)
CO2: 26 mmol/L (ref 22–32)
Calcium: 9.3 mg/dL (ref 8.9–10.3)
Chloride: 103 mmol/L (ref 98–111)
Creatinine, Ser: 0.8 mg/dL (ref 0.61–1.24)
GFR, Estimated: >60 mL/min (ref >60)
Glucose, Bld: 100 mg/dL — ABNORMAL HIGH (ref 70–99)
Potassium: 3.9 mmol/L (ref 3.5–5.1)
Sodium: 139 mmol/L (ref 135–145)
Total Bilirubin: 0.7 mg/dL (ref 0.3–1.2)
Total Protein: 8 g/dL (ref 6.5–8.1)

## 2021-11-23 LAB — D-DIMER, QUANTITATIVE: D-Dimer, Quant: 0.49 ug/mL-FEU (ref 0.00–0.50)

## 2021-11-23 LAB — TROPONIN I (HIGH SENSITIVITY): Troponin I (High Sensitivity): 3 ng/L (ref <18)

## 2021-11-23 MED ORDER — FAMOTIDINE 20 MG PO TABS
20.0000 mg | ORAL_TABLET | Freq: Two times a day (BID) | ORAL | 0 refills | Status: AC
Start: 2021-11-23 — End: ?

## 2021-11-23 MED ORDER — ACETAMINOPHEN 500 MG PO TABS
1000.0000 mg | ORAL_TABLET | Freq: Once | ORAL | Status: AC
  Administered 2021-11-23: 1000 mg via ORAL
  Filled 2021-11-23: qty 2

## 2021-11-23 MED ORDER — SUCRALFATE 1 G PO TABS: 1.0000 g | ORAL_TABLET | Freq: Three times a day (TID) | ORAL | 0 refills | Status: AC

## 2021-11-23 NOTE — ED Provider Notes (Signed)
[facility] EMERGENCY DEPARTMENT Provider Note   CSN: [id number] Arrival date & time: 11/23/21  1231     History  Chief Complaint  Patient presents with   Chest Pain    Bob Fowler is a 31 y.o. male.  HPI Patient presents with chest pain.  Patient is an IV drug user, and in spite of using methadone, last used a few days ago.  Today, without obvious precipitant developed chest tightness in the superior anterior chest.  Since onset has been persistent, no relief with aspirin.  He does note that he had 1 episode of nausea with vomiting prior to the onset of symptoms.    Home Medications Prior to Admission medications   Medication Sig Start Date End Date Taking? Authorizing Provider  acetaminophen (TYLENOL) 500 MG tablet Take 1,000 mg by mouth every 6 (six) hours as needed for mild pain.    [provider]  docusate sodium (COLACE) 100 MG capsule Take 1 capsule (100 mg total) by mouth daily as needed for mild constipation or moderate constipation. 01/05/21   Eric Form, PA-C  methadone (DOLOPHINE) 10 MG/5ML solution Take 130 mg by mouth daily.    [provider]      Allergies    Patient has no known allergies.    Review of Systems   Review of Systems  All other systems reviewed and are negative.   Physical Exam Updated Vital Signs BP 109/74   Pulse 78   Temp 98.3 F (36.8 C)   Resp 17   Ht 5\' 11"  (1.803 m)   Wt 127 kg   SpO2 99%   BMI 39.05 kg/m  Physical Exam Vitals and nursing note reviewed.  Constitutional:      General: He is not in acute distress.    Appearance: He is well-developed.  HENT:     Head: Normocephalic and atraumatic.  Eyes:     Conjunctiva/sclera: Conjunctivae normal.  Cardiovascular:     Rate and Rhythm: Normal rate and regular rhythm.  Pulmonary:     Effort: Pulmonary effort is normal. No respiratory distress.     Breath sounds: No stridor.  Abdominal:     General: There is no distension.   Skin:    General: Skin is warm and dry.     Comments: Multiple tattoos, and IV scar marks in both arms  Neurological:     Mental Status: He is alert and oriented to person, place, and time.     ED Results / Procedures / Treatments   Labs (all labs ordered are listed, but only abnormal results are displayed) Labs Reviewed  CBC WITH DIFFERENTIAL/PLATELET  D-DIMER, QUANTITATIVE  COMPREHENSIVE METABOLIC PANEL  TROPONIN I (HIGH SENSITIVITY)  TROPONIN I (HIGH SENSITIVITY)    EKG EKG Interpretation  Date/Time:  Tuesday November 23 2021 12:35:27 EDT Ventricular Rate:  74 PR Interval:  144 QRS Duration: 90 QT Interval:  414 QTC Calculation: 459 R Axis:   52 Text Interpretation: Normal sinus rhythm Normal ECG No previous ECGs available Confirmed by 06-22-1981 [id number]) on 11/23/2021 8:42:44 PM  Radiology DG Chest 2 View  Result Date: 11/23/2021 CLINICAL DATA:  Chest pain. EXAM: CHEST - 2 VIEW COMPARISON:  None Available. FINDINGS: The heart size and mediastinal contours are within normal limits. Both lungs are clear. The visualized skeletal structures are unremarkable. IMPRESSION: No active cardiopulmonary disease. Electronically Signed   By: 11/25/2021 M.D.   On: 11/23/2021 13:41    Procedures Procedures  Medications Ordered in ED Medications  acetaminophen (TYLENOL) tablet 1,000 mg (1,000 mg Oral Given 11/23/21 2252)    ED Course/ Medical Decision Making/ A&P This patient with a Hx of the drug use presents to the ED for concern of chest pain, dyspnea, this involves an extensive number of treatment options, and is a complaint that carries with it a high risk of complications and morbidity.    The differential diagnosis includes ACS, PE, esophagitis, pneumonia, less likely endocarditis given the absence of fever, abnormal heart sounds   Social Determinants of Health:  IV drug use  Additional history obtained:  Additional history and/or information obtained  from mother, bedside, notable for HPI   After the initial evaluation, orders, including: Labs x-ray ECG were initiated.   Patient placed on Cardiac and Pulse-Oximetry Monitors. The patient was maintained on a cardiac monitor.  The cardiac monitored showed an rhythm of 70 sinus normal The patient was also maintained on pulse oximetry. The readings were typically 100% room air normal   On repeat evaluation of the patient stayed the same  Lab Tests:  I personally interpreted labs.  The pertinent results include:-Labs with negative D-dimer, no leukocytosis  Imaging Studies ordered:  I independently visualized and interpreted imaging which showed x-ray without evidence for pneumonia or acute intrathoracic process I agree with the radiologist interpretation Dispostion / Final MDM:  After consideration of the diagnostic results and the patient's response to treatment, adult male presents with chest pain.  Onset was after vomiting there are some suspicion for esophagitis contributing to his symptoms, but given his risk profile including IV drug use, labs ordered, x-ray ordered, patient placed on continuous monitoring.  Initial findings reassuring, and at signout he is awaiting additional studies.   Final Clinical Impression(s) / ED Diagnoses Final diagnoses:  Atypical chest pain     Carmin Muskrat, MD 11/23/21 2327

## 2021-11-23 NOTE — ED Notes (Signed)
Pt. Hard stick request IV team

## 2021-11-23 NOTE — ED Notes (Signed)
Attempted PIV multiple times with one success however no blood return for lab work. IV team consult placed

## 2021-11-23 NOTE — Discharge Instructions (Signed)
As discussed, your evaluation today has been largely reassuring.  But, it is important that you monitor your condition carefully, and do not hesitate to return to the ED if you develop new, or concerning changes in your condition. ? ?Otherwise, please follow-up with your physician for appropriate ongoing care. ? ?

## 2021-11-23 NOTE — ED Notes (Signed)
Verbal report given to [place] at this time

## 2021-11-23 NOTE — ED Provider Triage Note (Signed)
Emergency Medicine Provider Triage Evaluation Note  Burley Kopka , a 31 y.o. male  was evaluated in triage.  Pt complains of 31 year old male presents to the ER today for evaluation of chest pain.  Patient's pain began last night.  Patient reports pleuritic pain with deep inspiration.  Patient reports the pain is retrosternal.  Nonradiating.  Pain has been constant.  Denies any shortness of breath.  Does report some diaphoresis.  No nausea or vomiting.  Patient reports history of IV drug use 2 months ago.  Patient did take aspirin at urgent care today.  Patient denies a history of PE/DVT.  No leg swelling or calf tenderness.  No recent surgeries or hospitalizations.  Patient denies cardiac history.  Review of Systems  Positive: Chest pain, shortness of breath Negative: Fever, chills, cough, nausea, vomiting  Physical Exam  BP 119/65   Pulse 79   Temp 99 F (37.2 C) (Oral)   Resp (!) 22   Ht 5\' 11"  (1.803 m)   Wt 127 kg   SpO2 94%   BMI 39.05 kg/m  Gen:   Awake, no distress   Resp:  Normal effort  MSK:   Moves extremities without difficulty  Other:  Heart regular rate and rhythm.  Lungs clear to auscultation bilaterally.  Radial pulses are 2+ bilaterally.  DP pulse are 2+ bilaterally.  No reproducible pain on palpation.  Medical Decision Making  Medically screening exam initiated at 12:50 PM.  Appropriate orders placed.  Alvah Lagrow was informed that the remainder of the evaluation will be completed by another provider, this initial triage assessment does not replace that evaluation, and the importance of remaining in the ED until their evaluation is complete.  Patient is in urgent care for evaluation of chest pain.  Given aspirin in urgent care.  Patient denies any cardiac history.  He is PERC positive given that patient's SPO2 is below 95%.  EKG shows normal sinus rhythm without any ischemic changes.  Differential diagnosis includes ACS, PE, septic emboli, musculoskeletal pain, GERD,  costochondritis, pleurisy.  Will obtain basic labs to include D-dimer and troponin.  Also obtain x-ray imaging.   Doristine Devoid, PA-C 11/23/21 1254

## 2021-11-23 NOTE — ED Triage Notes (Signed)
Per [facility] pt went to UC this morning for chest pain onset of last night. States he was playing video games when pain started. Pain worse with deep inspiration. Feels better when lifting hands over head. Daily smoker. Reports chronic dry cough.

## 2021-11-23 NOTE — ED Notes (Signed)
IV team at bedside at this time. 

## 2021-11-23 NOTE — ED Notes (Signed)
IV team at bedside 

## 2021-11-24 LAB — TROPONIN I (HIGH SENSITIVITY): Troponin I (High Sensitivity): 3 ng/L (ref <18)

## 2021-11-24 MED ORDER — KETOROLAC TROMETHAMINE 15 MG/ML IJ SOLN
15.0000 mg | Freq: Once | INTRAMUSCULAR | Status: AC
Start: 2021-11-24 — End: 2021-11-24
  Administered 2021-11-24: 15 mg via INTRAVENOUS
  Filled 2021-11-24: qty 1

## 2021-11-24 NOTE — ED Provider Notes (Signed)
I assumed care in signout to follow-up on labs.  Patient with a history of obesity and IV drug use presents with chest pain.  Patient tells me he has had pain with breathing throughout his chest but is also worse with movement.  He reports he vomited once.  He denies any known cardiac history.  No history of VTE Patient is in no distress.  He has been afebrile without leukocytosis There is no loud murmurs noted.  Suspicion for endocarditis is low. Troponins have been negative.  No acute EKG changes.  D-dimer is negative. Suspect this might be pleurisy.  Will advise NSAIDs. Patient is overall well-appearing in no acute distress.  We discussed strict return precautions   Ripley Fraise, MD 11/24/21 [phone]

## 2023-01-09 ENCOUNTER — Encounter (HOSPITAL_COMMUNITY): Payer: Self-pay | Admitting: Emergency Medicine

## 2023-01-09 ENCOUNTER — Ambulatory Visit (HOSPITAL_COMMUNITY)
Admission: EM | Admit: 2023-01-09 | Discharge: 2023-01-09 | Disposition: A | Discharge status: Home / Self Care | Payer: 59 | Attending: Family Medicine | Admitting: Family Medicine

## 2023-01-09 DIAGNOSIS — K529 Noninfective gastroenteritis and colitis, unspecified: Secondary | ICD-10-CM | POA: Diagnosis not present

## 2023-01-09 DIAGNOSIS — R1031 Right lower quadrant pain: Secondary | ICD-10-CM

## 2023-01-09 MED ORDER — ONDANSETRON 4 MG PO TBDP
ORAL_TABLET | ORAL | Status: AC
  Filled 2023-01-09: qty 1

## 2023-01-09 MED ORDER — ONDANSETRON 4 MG PO TBDP
4.0000 mg | ORAL_TABLET | Freq: Once | ORAL | Status: AC
  Administered 2023-01-09: 4 mg via ORAL

## 2023-01-09 MED ORDER — KETOROLAC TROMETHAMINE 30 MG/ML IJ SOLN
INTRAMUSCULAR | Status: AC
  Filled 2023-01-09: qty 1

## 2023-01-09 MED ORDER — ONDANSETRON 4 MG PO TBDP
4.0000 mg | ORAL_TABLET | Freq: Three times a day (TID) | ORAL | 0 refills | Status: AC | PRN
Start: 2023-01-09 — End: ?

## 2023-01-09 MED ORDER — KETOROLAC TROMETHAMINE 30 MG/ML IJ SOLN
30.0000 mg | Freq: Once | INTRAMUSCULAR | Status: AC
  Administered 2023-01-09: 30 mg via INTRAMUSCULAR

## 2023-01-09 NOTE — ED Provider Notes (Signed)
[facility]    CSN: [id number] Arrival date & time: 01/09/23  0931      History   Chief Complaint Chief Complaint  Patient presents with   Nausea   Emesis    HPI Bob Fowler is a 32 y.o. male.    Emesis Here for nausea and vomiting and abdominal pain.  Symptoms began yesterday.  He is hurting in his right lower quadrant.  He has had nausea and throwing up since yesterday and is thrown up numerous times.  He last threw up about 3 hours ago, but is still nauseated.  He had 1 episode of loose stool yesterday, but that was after he had taken a Dulcolax, as he felt he been constipated.  No blood in the stool or emesis.  No fever or chills.  Past surgical history significant for appendectomy  No allergies to medications Past Medical History:  Diagnosis Date   Anxiety    Substance abuse [facility])     Patient Active Problem List   Diagnosis Date Noted   Appendicitis 01/04/2021   Vaccine counseling 06/08/2017   Chronic hepatitis C without hepatic coma (HCC) 05/16/2017   Sweaty skin 12/07/2016   Tobacco abuse 05/04/2015   Anxiety 05/04/2015   History of narcotic addiction (HCC) 08/27/2014    Past Surgical History:  Procedure Laterality Date   LAPAROSCOPIC APPENDECTOMY N/A 01/05/2021   Procedure: APPENDECTOMY LAPAROSCOPIC;  Surgeon: Violeta Gelinas, MD;  Location: [facility] OR;  Service: General;  Laterality: N/A;       Home Medications    Prior to Admission medications   Medication Sig Start Date End Date Taking? Authorizing Provider  ondansetron (ZOFRAN-ODT) 4 MG disintegrating tablet Take 1 tablet (4 mg total) by mouth every 8 (eight) hours as needed for nausea or vomiting. 01/09/23  Yes Zenia Resides, MD  acetaminophen (TYLENOL) 500 MG tablet Take 1,000 mg by mouth every 6 (six) hours as needed for mild pain.    [provider]  docusate sodium (COLACE) 100 MG capsule Take 1 capsule (100 mg total) by mouth daily as needed for mild  constipation or moderate constipation. 01/05/21   Eric Form, PA-C  famotidine (PEPCID) 20 MG tablet Take 1 tablet (20 mg total) by mouth 2 (two) times daily. Patient not taking: Reported on 01/09/2023 11/23/21   Gerhard Munch, MD  methadone (DOLOPHINE) 10 MG/5ML solution Take 130 mg by mouth daily.    [provider]  sucralfate (CARAFATE) 1 g tablet Take 1 tablet (1 g total) by mouth 4 (four) times daily -  with meals and at bedtime. Patient not taking: Reported on 01/09/2023 11/23/21   Gerhard Munch, MD    Family History Family History  Problem Relation Age of Onset   Diabetes Mother    Mental illness Maternal Grandmother    Heart disease Maternal Grandfather     Social History Social History   Tobacco Use   Smoking status: Every Day    Current packs/day: 1.00    Types: Cigarettes   Smokeless tobacco: Never  Vaping Use   Vaping status: Never Used  Substance Use Topics   Alcohol use: Yes    Alcohol/week: 0.0 standard drinks of alcohol   Drug use: Yes    Types: Marijuana     Allergies   Patient has no known allergies.   Review of Systems Review of Systems  Gastrointestinal:  Positive for vomiting.     Physical Exam Triage Vital Signs ED Triage Vitals  Encounter Vitals  Group     BP 01/09/23 1022 104/70     Systolic BP Percentile --      Diastolic BP Percentile --      Pulse Rate 01/09/23 1022 62     Resp 01/09/23 1022 17     Temp 01/09/23 1022 98.5 F (36.9 C)     Temp Source 01/09/23 1022 Oral     SpO2 01/09/23 1022 96 %     Weight --      Height --      Head Circumference --      Peak Flow --      Pain Score 01/09/23 1023 7     Pain Loc --      Pain Education --      Exclude from Growth Chart --    No data found.  Updated Vital Signs BP 104/70 (BP Location: Right Arm)   Pulse 62   Temp 98.5 F (36.9 C) (Oral)   Resp 17   SpO2 96%   Visual Acuity Right Eye Distance:   Left Eye Distance:   Bilateral Distance:    Right  Eye Near:   Left Eye Near:    Bilateral Near:     Physical Exam Vitals reviewed.  Constitutional:      General: He is not in acute distress.    Appearance: He is not ill-appearing, toxic-appearing or diaphoretic.  HENT:     Nose: Nose normal.     Mouth/Throat:     Mouth: Mucous membranes are moist.     Pharynx: No oropharyngeal exudate or posterior oropharyngeal erythema.  Eyes:     Extraocular Movements: Extraocular movements intact.     Conjunctiva/sclera: Conjunctivae normal.     Pupils: Pupils are equal, round, and reactive to light.  Cardiovascular:     Rate and Rhythm: Normal rate and regular rhythm.     Heart sounds: No murmur heard. Pulmonary:     Effort: Pulmonary effort is normal.     Breath sounds: Normal breath sounds.  Abdominal:     General: There is no distension.     Palpations: Abdomen is soft.     Tenderness: There is no guarding.     Comments: There is some mild tenderness in the right lower quadrant. Bowel sounds are present but hypoactive.  Musculoskeletal:     Cervical back: Neck supple. No rigidity.  Lymphadenopathy:     Cervical: No cervical adenopathy.  Skin:    Coloration: Skin is not jaundiced or pale.  Neurological:     General: No focal deficit present.     Mental Status: He is alert and oriented to person, place, and time.  Psychiatric:        Behavior: Behavior normal.      UC Treatments / Results  Labs (all labs ordered are listed, but only abnormal results are displayed) Labs Reviewed - No data to display  EKG   Radiology No results found.  Procedures Procedures (including critical care time)  Medications Ordered in UC Medications  ketorolac (TORADOL) 30 MG/ML injection 30 mg (has no administration in time range)  ondansetron (ZOFRAN-ODT) disintegrating tablet 4 mg (has no administration in time range)    Initial Impression / Assessment and Plan / UC Course  I have reviewed the triage vital signs and the nursing  notes.  Pertinent labs & imaging results that were available during my care of the patient were reviewed by me and considered in my medical decision making (see chart for  details).     Zofran is sent to the pharmacy and he is given 1 dose here.  Toradol injections given here for the abdominal pain.  If his pain is not relieved or he worsens, or if his vomiting continues despite medication, he is to proceed to the emergency room for further evaluation. Final Clinical Impressions(s) / UC Diagnoses   Final diagnoses:  Gastroenteritis  RLQ abdominal pain     Discharge Instructions      Ondansetron dissolved in the mouth every 8 hours as needed for nausea or vomiting. Clear liquids(water, gatorade/pedialyte, ginger ale/sprite, chicken broth/soup) and bland things(crackers/toast, rice, potato, bananas) to eat. Avoid acidic foods like lemon/lime/orange/tomato, and avoid greasy/spicy foods. You have been given 1 dose of this medication here in the clinic  You have been given a shot of Toradol 30 mg today.  At home I would stick with Tylenol as needed for pain.  If your pain is not relieved or you continue to vomit or if your pain is worse, please go to the emergency room for further evaluation.      ED Prescriptions     Medication Sig Dispense Auth. Provider   ondansetron (ZOFRAN-ODT) 4 MG disintegrating tablet Take 1 tablet (4 mg total) by mouth every 8 (eight) hours as needed for nausea or vomiting. 10 tablet Marlinda Mike Janace Aris, MD      PDMP not reviewed this encounter.   Zenia Resides, MD 01/09/23 1045

## 2023-01-09 NOTE — Discharge Instructions (Signed)
Ondansetron dissolved in the mouth every 8 hours as needed for nausea or vomiting. Clear liquids(water, gatorade/pedialyte, ginger ale/sprite, chicken broth/soup) and bland things(crackers/toast, rice, potato, bananas) to eat. Avoid acidic foods like lemon/lime/orange/tomato, and avoid greasy/spicy foods. You have been given 1 dose of this medication here in the clinic  You have been given a shot of Toradol 30 mg today.  At home I would stick with Tylenol as needed for pain.  If your pain is not relieved or you continue to vomit or if your pain is worse, please go to the emergency room for further evaluation.

## 2023-01-09 NOTE — ED Triage Notes (Signed)
Pt c/o nausea, vomiting and stomach pain since yesterday. Pt states he has thrown up multiple times

## 2023-01-10 ENCOUNTER — Encounter: Payer: Self-pay | Admitting: Gastroenterology

## 2023-01-23 DIAGNOSIS — R1013 Epigastric pain: Secondary | ICD-10-CM | POA: Diagnosis not present

## 2023-01-23 DIAGNOSIS — Z8619 Personal history of other infectious and parasitic diseases: Secondary | ICD-10-CM | POA: Diagnosis not present

## 2023-01-23 DIAGNOSIS — E669 Obesity, unspecified: Secondary | ICD-10-CM | POA: Diagnosis not present

## 2023-01-23 DIAGNOSIS — Z720 Tobacco use: Secondary | ICD-10-CM | POA: Diagnosis not present

## 2023-01-23 DIAGNOSIS — F1111 Opioid abuse, in remission: Secondary | ICD-10-CM | POA: Diagnosis not present

## 2023-02-01 ENCOUNTER — Ambulatory Visit: Payer: 59 | Admitting: Gastroenterology

## 2023-03-09 IMAGING — CT CT ABD-PELV W/ CM
2 of 4 series · 17 of 46 positions shown, 19 images · IV contrast (APPLIED)
Comparison: None.

CLINICAL DATA: Right lower quadrant pain, nausea/vomiting

EXAM:
CT ABDOMEN AND PELVIS WITH CONTRAST
TECHNIQUE: Multidetector CT imaging of the abdomen and pelvis was performed
using the standard protocol following bolus administration of
intravenous contrast.
CONTRAST:  100mL OMNIPAQUE IOHEXOL 300 MG/ML  SOLN

[Series 3: abdomen 5.0 · axial · 0.98mm/px · z∈[+896,+1326]mm · 14 of 98 slices shown, 16 images]
[im 6/98  soft-tissue]
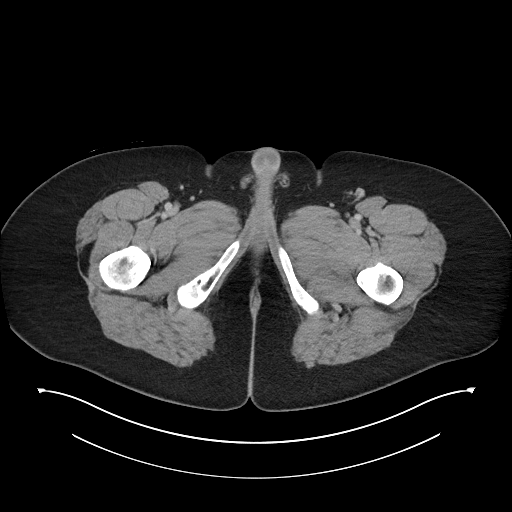
[im 6/98  bone]
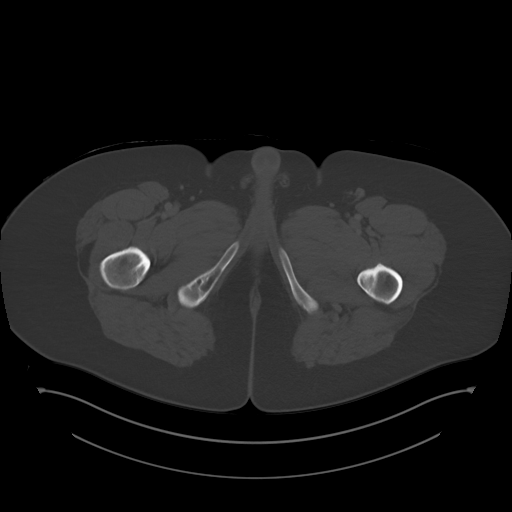
[im 11/98  soft-tissue]
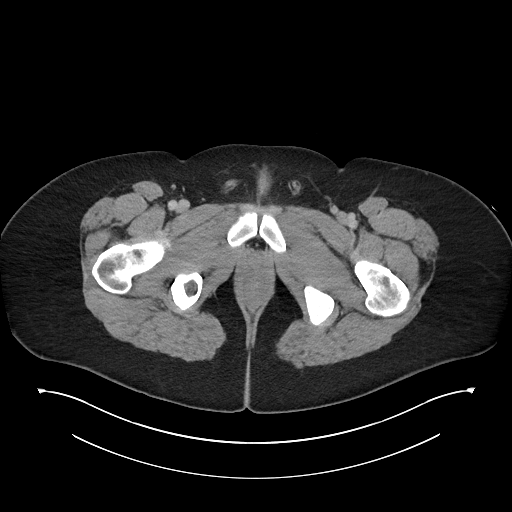
[im 21/98  soft-tissue]
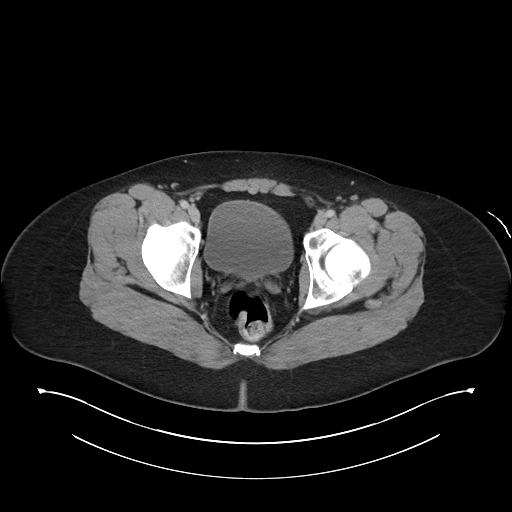
[im 26/98  soft-tissue]
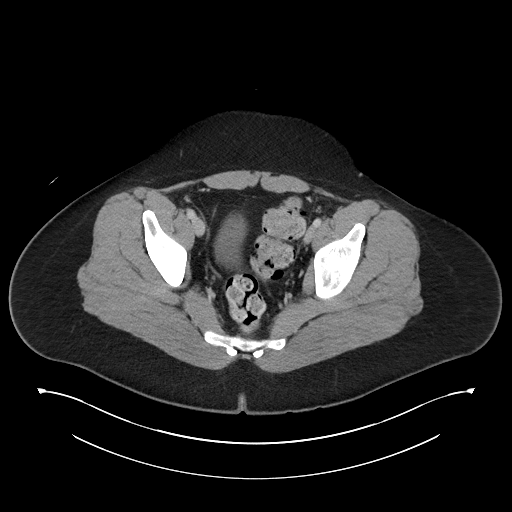
[im 31/98  soft-tissue]
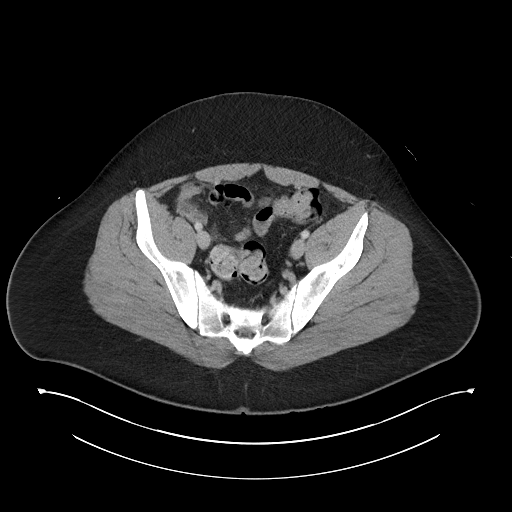
[im 41/98  soft-tissue]
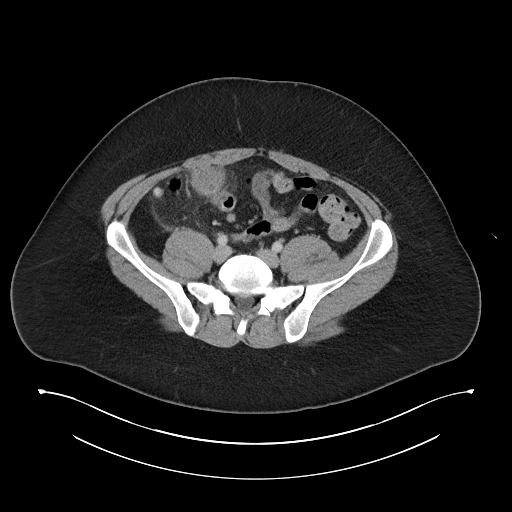
[im 46/98  soft-tissue]
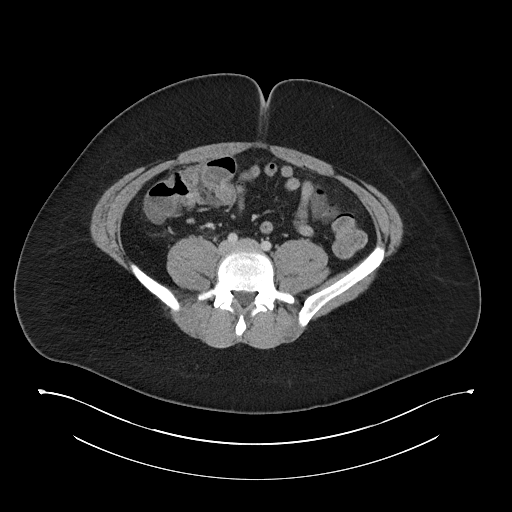
[im 52/98  soft-tissue]
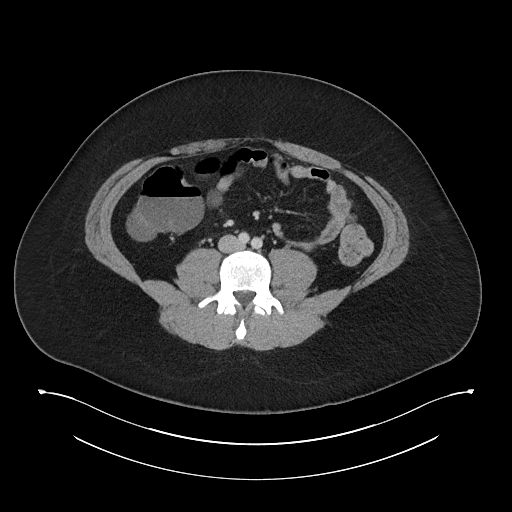
[im 57/98  soft-tissue]
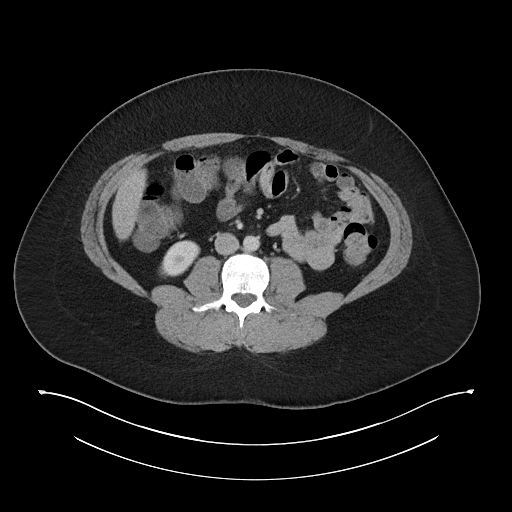
[im 57/98  bone]
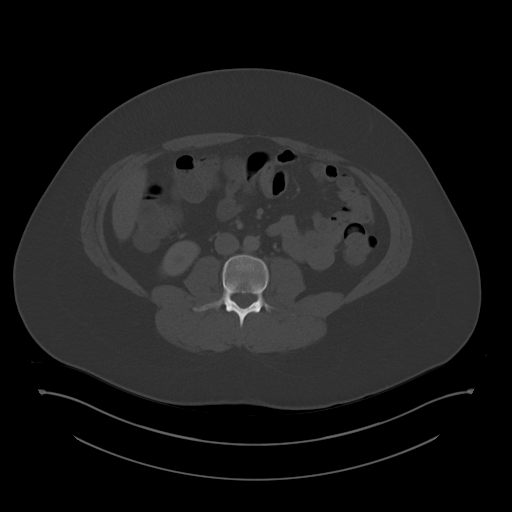
[im 67/98  soft-tissue]
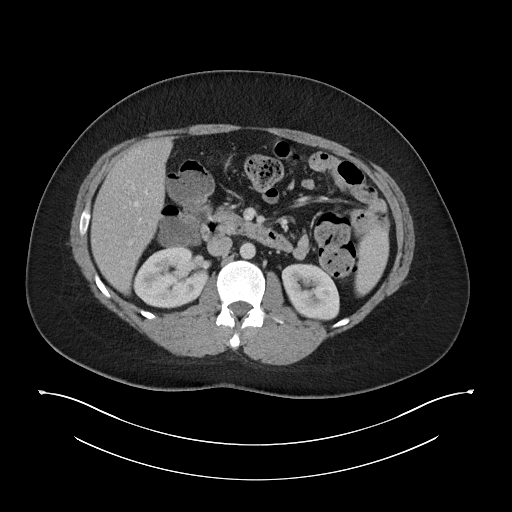
[im 72/98  soft-tissue]
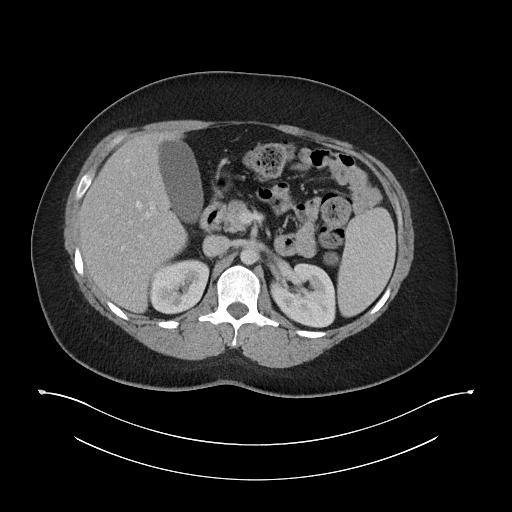
[im 77/98  soft-tissue]
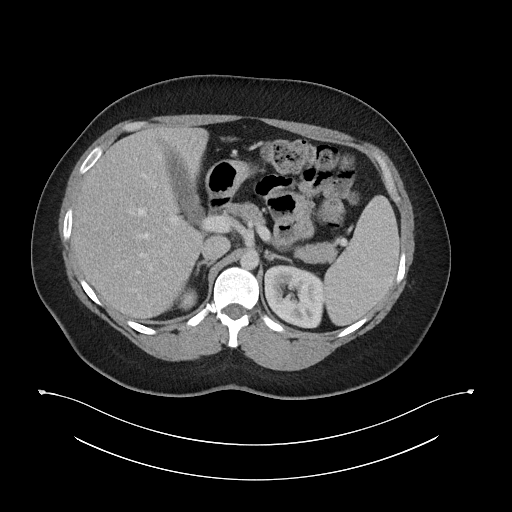
[im 87/98  soft-tissue]
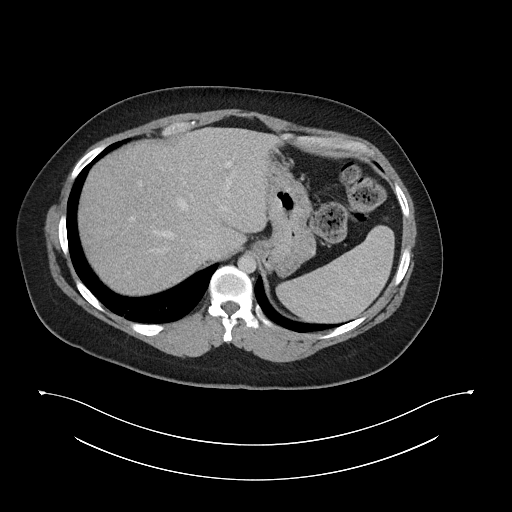
[im 92/98  soft-tissue]
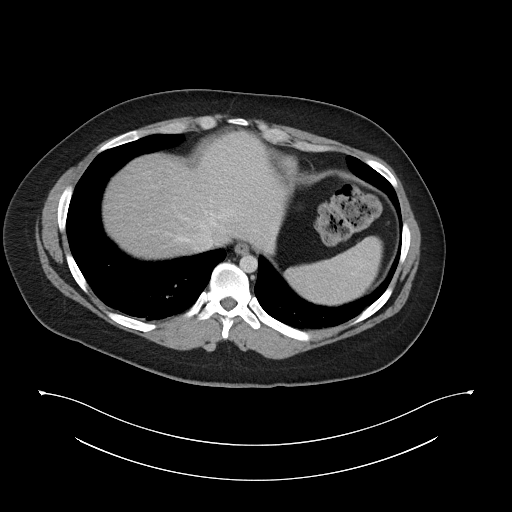

[Series 6: abdomen 3.0 mpr cor · coronal · 0.98mm/px · 3 of 114 slices shown]
[im 38/114  soft-tissue]
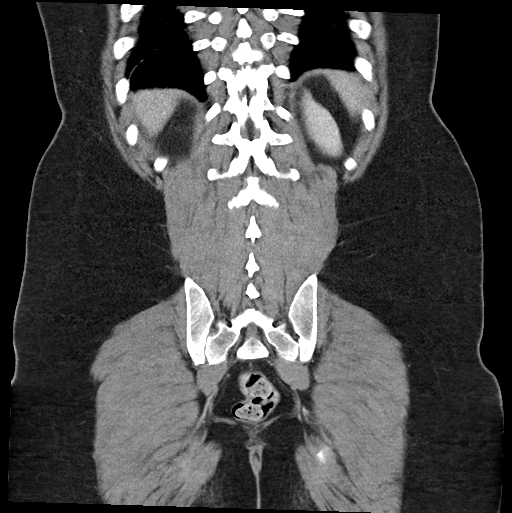
[im 51/114  soft-tissue]
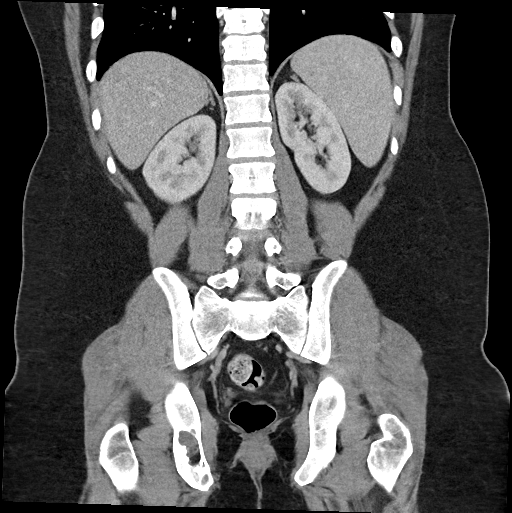
[im 63/114  soft-tissue]
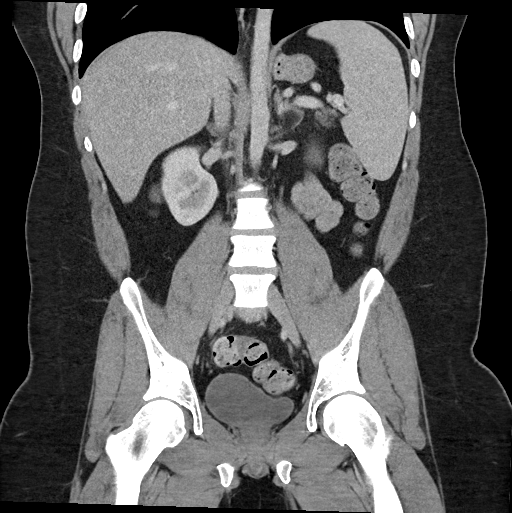

[17 of 46 positions shown; findings below may reference images not displayed]

FINDINGS: Lower chest: Minimal subpleural reticulation at the lung bases.

Hepatobiliary: Liver is within normal limits, noting focal fat
altered perfusion along the falciform ligament.

Gallbladder is unremarkable. No intrahepatic or extrahepatic ductal
dilatation.

Pancreas: Within normal limits.

Spleen: Within normal limits.

Adrenals/Urinary Tract: Adrenal glands are within normal limits.

Kidneys are within normal limits.  No hydronephrosis.

Bladder is within normal limits.

Stomach/Bowel: Stomach is within normal limits.

No evidence of bowel obstruction.

Dilated proximal appendix, measuring 16 mm, with periappendiceal
inflammatory changes, reflecting acute appendicitis (series 3/image
62). Associated small distal appendicolith (series 3/image 83). No
drainable fluid collection/abscess. No free air to suggest
macroscopic perforation.

Vascular/Lymphatic: No evidence of abdominal aortic aneurysm.

No suspicious abdominopelvic lymphadenopathy.

Reproductive: Prostate is unremarkable.

Other: No abdominopelvic ascites.

Musculoskeletal: Visualized osseous structures are within normal
limits.
IMPRESSION: Acute appendicitis with distal appendicolith. No drainable fluid
collection/abscess. No free air to suggest macroscopic perforation.
